# Patient Record
Sex: Male | Born: 1970 | Race: Black or African American
Health system: Northeastern US, Academic
[De-identification: ages and names within clinical notes are randomized; demographics above are authoritative.]

## PROBLEM LIST (undated history)

## (undated) DIAGNOSIS — I10 Essential (primary) hypertension: Secondary | ICD-10-CM

## (undated) DIAGNOSIS — E559 Vitamin D deficiency, unspecified: Principal | ICD-10-CM

## (undated) DIAGNOSIS — D509 Iron deficiency anemia, unspecified: Secondary | ICD-10-CM

## (undated) DIAGNOSIS — N200 Calculus of kidney: Secondary | ICD-10-CM

## (undated) HISTORY — DX: Essential (primary) hypertension: I10

---

## 2010-10-01 LAB — URINE MICROSCOPIC ONLY
Bacteria: NEGATIVE /HPF
RBC: 20 /HPF (ref 0–5)
WBC: 1 /HPF (ref 0–4)

## 2010-10-01 LAB — URINALYSIS W/ RFLX MICROSCOPIC
Bilirubin: NEGATIVE
Glucose: NEGATIVE MG/DL
Leukocyte Esterase: NEGATIVE
Nitrites: NEGATIVE
Protein: NEGATIVE MG/DL
Specific gravity: 1.02 (ref 1.003–1.030)
Urobilinogen: 0.2 EU/DL (ref 0.2–1.0)
pH (UA): 7.5 (ref 5.0–8.0)

## 2011-02-07 MED ORDER — METRONIDAZOLE 500 MG TAB
500 mg | ORAL_TABLET | Freq: Two times a day (BID) | ORAL | Status: AC
Start: 2011-02-07 — End: 2011-02-14

## 2011-02-07 NOTE — ED Provider Notes (Signed)
I was personally available for consultation in the emergency department.  I have reviewed the chart and agree with the documentation recorded by the MLP, including the assessment, treatment plan, and disposition.  Leroy Pettway E Kayce Chismar

## 2011-02-07 NOTE — ED Provider Notes (Signed)
Patient is a 40 y.o. male presenting with STD exposure. The history is provided by the patient.   Exposure to STD  Pertinent negatives include no nausea, no vomiting, no abdominal pain and no penile pain.   Pt states 3 days ago he was told by his sexual partner that she tested positive for Trichomonas.  No dysuria.  No penile discharge.  No increased urgency or frequency.  No bd pain.  No back pain.  No fever or chills.  No NVD.  Normal appetite and activity level.  States he does not always use a condom.    No past medical history on file.     Past Surgical History   Procedure Date   ??? Hx orthopaedic      left leg pins         No family history on file.     History     Social History   ??? Marital Status: Single     Spouse Name: N/A     Number of Children: N/A   ??? Years of Education: N/A     Occupational History   ??? Not on file.     Social History Main Topics   ??? Smoking status: Never Smoker    ??? Smokeless tobacco: Never Used   ??? Alcohol Use: Yes      occasional   ??? Drug Use: Yes     Special: Marijuana   ??? Sexually Active:      Other Topics Concern   ??? Not on file     Social History Narrative   ??? No narrative on file                  ALLERGIES: Review of patient's allergies indicates no known allergies.      Review of Systems   Constitutional: Negative for fever, chills and appetite change.   HENT: Negative for neck pain and neck stiffness.    Respiratory: Negative for cough and chest tightness.    Cardiovascular: Negative for chest pain, palpitations and leg swelling.   Gastrointestinal: Negative for nausea, vomiting, abdominal pain and diarrhea.   Genitourinary: Negative for dysuria, urgency, frequency, hematuria, flank pain, discharge and penile pain.   Musculoskeletal: Negative for myalgias, back pain, joint swelling, arthralgias and gait problem.   Skin: Negative for pallor and rash.   Neurological: Negative for weakness, light-headedness and headaches.       Filed Vitals:    02/07/11 1332   BP: 142/99    Pulse: 76   Temp: 98.6 ??F (37 ??C)   Resp: 16   Height: 6\' 1"  (1.854 m)   Weight: 77.111 kg (170 lb)   SpO2: 100%            Physical Exam   Constitutional: He is oriented to person, place, and time. He appears well-developed and well-nourished. No distress.   HENT:   Head: Normocephalic and atraumatic.   Neck: Normal range of motion. Neck supple. No tracheal deviation present. No thyromegaly present.   Abdominal: Soft. Bowel sounds are normal. He exhibits no distension and no mass. There is no tenderness. There is no rebound and no guarding.   Genitourinary: Penis normal. No penile tenderness.        No penile discharge.  No lesions.  No lymphadenopathy.   Musculoskeletal: Normal range of motion.   Lymphadenopathy:     He has no cervical adenopathy.   Neurological: He is alert and oriented to person, place, and time.  Skin: Skin is warm and dry. No rash noted. No erythema.   Psychiatric: He has a normal mood and affect. His behavior is normal.        MDM    Procedures

## 2011-02-07 NOTE — ED Notes (Signed)
Pt states that his partner called and tested positive for Trich

## 2011-02-07 NOTE — ED Notes (Signed)
Tim Peterson is a 40 y.o. male that was discharged in good.  The patients diagnosis, condition and treatment were explained to  patient and aftercare instructions were given.  The patient verbalized understanding. Patient armband removed and shredded.

## 2013-03-16 ENCOUNTER — Encounter: Payer: Self-pay | Admitting: Emergency Medicine

## 2013-03-16 ENCOUNTER — Emergency Department
Admission: AD | Admit: 2013-03-16 | Disposition: A | Discharge status: Home / Self Care | Payer: Self-pay | Source: Ambulatory Visit

## 2013-03-16 ENCOUNTER — Other Ambulatory Visit: Payer: Self-pay | Admitting: Gastroenterology

## 2013-03-16 LAB — HOLD BLUE

## 2013-03-16 LAB — INFLUENZA A & B PCR: Influenza A & B PCR: 0

## 2013-03-16 LAB — HOLD GREEN WITH GEL

## 2013-03-16 LAB — HOLD LAVENDER

## 2013-03-16 LAB — BLOOD BANK HOLD LAVENDER

## 2013-03-16 LAB — HOLD SST

## 2013-03-16 MED ORDER — KETOROLAC TROMETHAMINE 30 MG/ML IJ SOLN *I*
30.0000 mg | Freq: Once | INTRAMUSCULAR | Status: AC
Start: 2013-03-16 — End: 2013-03-16
  Administered 2013-03-16: 30 mg via INTRAVENOUS
  Filled 2013-03-16: qty 1

## 2013-03-16 MED ORDER — HYDROCODONE-ACETAMINOPHEN 5-325 MG PO TABS *I*
1.0000 | ORAL_TABLET | Freq: Four times a day (QID) | ORAL | Status: DC | PRN
Start: 2013-03-16 — End: 2013-03-30

## 2013-03-16 MED ORDER — SODIUM CHLORIDE 0.9 % IV BOLUS *I*
1000.0000 mL | Freq: Once | Status: AC
Start: 2013-03-16 — End: 2013-03-16
  Administered 2013-03-16: 1000 mL via INTRAVENOUS

## 2013-03-16 MED ORDER — HYDROCODONE-ACETAMINOPHEN 5-325 MG PO TABS *I*
1.0000 | ORAL_TABLET | Freq: Once | ORAL | Status: AC
Start: 2013-03-16 — End: 2013-03-16
  Administered 2013-03-16: 1 via ORAL
  Filled 2013-03-16: qty 1

## 2013-03-16 NOTE — ED Provider Progress Notes (Signed)
ED Provider Progress Note    Pt received on sign out from G. Florio PA-C pending influenza swab. Results negative. Pt continues to have pain. Will give norco. Discharge home. Follow up with PCP       Providence Crosby, PA, 03/16/2013, 9:01 AM    Supervising physician Noah Charon was immediately available

## 2013-03-16 NOTE — ED Notes (Signed)
Pt c/o R lower rib cage pain, pt states that pain began 2 days ago, pt denies SOB, pt states that he is pain free if he sits or lays still, pt states that pain worsens with deep inspiration and movement, pt denies injury, IV established, labs drawn and sent, pt resting in bed, pt on telemetry, call bell in reach, awaiting provider eval

## 2013-03-16 NOTE — ED Notes (Signed)
Pt sleeping. 

## 2013-03-16 NOTE — ED Notes (Signed)
Pt to XR

## 2013-03-16 NOTE — ED Notes (Signed)
Right sided chest pain non radiating that began on 03-15-13, increased discomfort with deep inspiration

## 2013-03-16 NOTE — ED Provider Notes (Signed)
History     Chief Complaint   Patient presents with   . Chest Pain     HPI Comments: This is a 42 yo male who presents to the ED with complaints of right lower chest pain x 2 days. Pt has been in the hospital with his wife who is admitted.  Pt states has been coughing. No fevers or chills. No nausea.  No recent travel. No lower extremity pain or swelling. Pt states that pain is worse when you press on area and when he takes  Deep breath. Pain does not radiate       History provided by:  Patient      History reviewed. No pertinent past medical history.         History reviewed. No pertinent past surgical history.    History reviewed. No pertinent family history.      Social History      reports that he has never smoked. He does not have any smokeless tobacco history on file. He reports that he does not drink alcohol or use illicit drugs. His sexual activity history is not on file.    Living Situation    Questions Responses    Patient lives with Spouse    Homeless     Caregiver for other family member     External Services     Employment     Domestic Violence Risk           Problem List     There is no problem list on file for this patient.      Review of Systems   Review of Systems   Constitutional: Negative for fever and chills.   HENT: Negative for ear pain, congestion, sore throat, trouble swallowing, neck pain and neck stiffness.    Respiratory: Positive for cough. Negative for shortness of breath.    Cardiovascular: Positive for chest pain. Negative for palpitations and leg swelling.   Gastrointestinal: Negative for nausea, vomiting and abdominal pain.   Genitourinary: Negative for difficulty urinating.   Musculoskeletal: Positive for arthralgias. Negative for myalgias.   Skin: Negative for rash and wound.   Neurological: Negative for dizziness, weakness, numbness and headaches.       Physical Exam     ED Triage Vitals   BP Pulse Heart Rate(via Pulse Ox) Resp Temp Temp Source SpO2 O2 Device O2 Flow Rate      03/16/13 0241 -- 03/16/13 0239 03/16/13 0239 03/16/13 0239 03/16/13 0239 03/16/13 0239 03/16/13 0239 --   151/106 mmHg  68 16 37.1 C (98.8 F) TEMPORAL 100 % None (Room air)       Weight           03/16/13 0239           79.379 kg (175 lb)               Physical Exam   Constitutional: He is oriented to person, place, and time. He appears well-developed and well-nourished.   HENT:   Head: Normocephalic and atraumatic.   Mouth/Throat: Oropharynx is clear and moist.   Eyes: Conjunctivae and EOM are normal.   Cardiovascular: Normal rate, regular rhythm and normal heart sounds.    Pulmonary/Chest: Effort normal and breath sounds normal. Chest wall is not dull to percussion. He exhibits tenderness. He exhibits no crepitus, no deformity and no retraction.       Abdominal: Soft. Bowel sounds are normal.   Musculoskeletal: Normal range of motion.   Neurological:  He is alert and oriented to person, place, and time.   Skin: Skin is warm and dry.   Psychiatric: He has a normal mood and affect.       Medical Decision Making      Amount and/or Complexity of Data Reviewed  Clinical lab tests: reviewed and ordered  Tests in the radiology section of CPT: ordered and reviewed        Initial Evaluation:  ED First Provider Contact    Date/Time Event User Comments    03/16/13 [phone] ED Provider First Contact Corliss Skains J Initial Face to Face Provider Contact          Patient seen by me as above    Assessment:  42 y.o., male comes to the ED with lower right chest pain reproducible, with cough    Differential Diagnosis includes costochondritis,  i have a very low suspicion for as or PE.  Possible flu              Plan: eval, flu swab. Cxr, fluids, torodol       Landis Martins, [place]    Supervising physician Dr. Rada Hay was immediately available      Landis Martins, [place]  03/16/13 [phone]

## 2013-03-16 NOTE — Discharge Instructions (Signed)
You were seen in the ED for chest pain. Given your symptoms and evaluation your diagnosis is most likely costochondritis.    You should follow up in 1 week with your primary doctor.    You should continue to take your home medications as prescribed. Please use hydrocodone for severe pain. Use as prescribed. Do not drink alcohol, drive, or operate machinery while on this medication. Do not take additional Tylenol or products containing acetaminophen while on this medication. Take with food to prevent stomach upset/nausea/vomiting.  Please use ibuprofen for pain. Use 600 mg of ibuprofen 3 times daily for pain. Take with food to prevent stomach upset/nausea/vomiting.    Please use ice on your injury - apply for 20 minutes every 1-2 hours while awake for next 2 days.     Return to the ED should you have any further acute medical problems or any worsening symptoms, especially shortness of breath.    Should you have any further questions about your care or evaluation you should call or make an appointment with your primary physician to review them.

## 2013-03-25 ENCOUNTER — Ambulatory Visit: Payer: Self-pay | Admitting: Cardiovascular Disease

## 2013-03-26 LAB — EKG 12-LEAD
P: 45 degrees
QRS: 35 degrees
Rate: 58 {beats}/min
T: 58 degrees

## 2013-03-30 ENCOUNTER — Encounter: Payer: Self-pay | Admitting: Gastroenterology

## 2013-03-30 ENCOUNTER — Ambulatory Visit: Payer: Self-pay | Admitting: Cardiovascular Disease

## 2013-03-30 ENCOUNTER — Encounter: Payer: Self-pay | Admitting: Cardiovascular Disease

## 2013-03-30 VITALS — BP 132/64 | HR 64 | Ht 73.0 in | Wt 170.0 lb

## 2013-03-30 DIAGNOSIS — I10 Essential (primary) hypertension: Secondary | ICD-10-CM

## 2013-03-30 DIAGNOSIS — Z0000 Encounter for general adult medical examination without abnormal findings: Secondary | ICD-10-CM

## 2013-03-30 NOTE — Progress Notes (Addendum)
[facility]   Academic Practice Note    Subjective     Chief Concern: Jackson Dunn ["Jackson Dunn"] is a 43 y.o. male who presents to establish care     Last saw a doctor ~ 20 years ago, but was in prison for ~ 3 years [~ 5 years prior] in which he was started on HCTZ for HTN. Patient states that he had his immunizations done, was tested for HIV 2 years prior, and had a TB skin test done upon his release from prison.     Patient has noticed LAD on his legs/arms that have been stable in size. No pain, erythema, pruritis. They are mobile. Lump on RIGHT lateral thigh has been present for ~ 5 years.     He has also been trying to gain weight but has been able to. Wife cooks healthily meals. Patient does not endorse any loose stools, N/V, or flatulence.      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; [place]  Allergies: NKMA  Meds: No active medications      ROS  As per HPI, otherwise reviewed and noncontributory    Objective     BP 132/64   Pulse 64   Ht 1.854 m (6\' 1" )   Wt 77.111 kg (170 lb)   BMI 22.43 kg/m2     GEN: pleasant, NAD, A&Ox3  HENT: NCAT,  MMM, anicteric, TMs w/o erythema or discharge  NECK: supple; no cervical lymphadenopathy, no thyriodmegaly   CVS: RRR, S1S2, no rubs/murmurs/gallops  PULM: non-labored, CTAB, good inspiratory effort  ABD: soft, NDNT, no organomegaly appreciated  EXT: warm, dry, + radial, no edema; two soft, mobile lump on RIGHT thigh ~ 3-4 cm w/o erythema, induration, or swelling; small, mobile lump on distal RIGHT arm ~ 1-2 cm     Labs  No recent abs to review     Assessment   Plan     Jackson Dunn was seen today for healthcare maintenance    Health care maintenance  Patient is up to date on immunizations [declined influ] and  screenings. Advised patient on proper diet, exercise, and safety. Will order CBC, BMP, lipid panel, and A1c.     Hypetension [stable]  Patient is no longer taking HCTZ that was prescribed while he was in prison ~ 5 years prior. His BP is currently well controlled and under his JNC 8 guidelines. Will continue to monitor with diet and exercise [no medications at this time]. Follow up in 6 months for BP.     Will check a BMP for Cr function.      PCMH Hypertension Plan  Based on this patients clinical history and according to JNC guidelines target BP goal is: less than 140/90 (or 150/90 due to age [facility](JNC8))  Based on the patient's last BP of BP: 132/64 mmHg the patient is: at goal  The plan to reach goal   1.  Reviewed pt's understanding of medications including any barriers to adherence.   2.  Recommended lifestyle modifications: exercise plan and dietary sodium reduction   3.  The patient understands their clinical goals and will undertake self-management recommendations including:all     4.  Medication Management: no changes made    5.  Referral to Care Management:: No   6.  Patient Ed/Self-management tools provided: N/A    Health Maintenance  Colonoscopy: Will address at age 48  Immunizations: Tdap [awiating records], Influ [declined]  Hgb A1c: will order today    FU Visit: 6 months for HTN    Charlett Nose, MD on 03/30/2013 at 1:45 PM    Attending Physician Addendum:    I personally interviewed, examined and discussed the care of Jackson Dunn with Charlett Nose, MD at the time of the visit including HPI, PMH, home mediations, family history, social history, and ROS, as well as physical examination, labs, assessment, and plans. I agree with the assessment and plan of care as documented above.      Key elements:   HTN.  Started on Lisinopril while in jail a few years ago but did not continue it.  Below goal today on no meds.  MJ.  Occasional.  Not causing problems in life.  Multiple lipomas.  Soft, mobile, no  change per pt over multiple years.  No suggestion of neoplasm.  HCM.  Lipids, a1c, BMP.    Windy Canny, MD,PHD, Faculty, [facility] 03/30/2013, 2:55 PM

## 2013-03-30 NOTE — Patient Instructions (Addendum)
You were seen in the clinic today to establish care. Please get your blood work done. Please get your immunization records and have them sent over to our office.     Please let us know if you have any questions

## 2013-03-31 ENCOUNTER — Ambulatory Visit
Admit: 2013-03-31 | Discharge: 2013-03-31 | Disposition: A | Discharge status: Home / Self Care | Payer: Self-pay | Source: Ambulatory Visit | Attending: Cardiovascular Disease | Admitting: Cardiovascular Disease

## 2013-03-31 DIAGNOSIS — Z0000 Encounter for general adult medical examination without abnormal findings: Secondary | ICD-10-CM

## 2013-03-31 DIAGNOSIS — I10 Essential (primary) hypertension: Secondary | ICD-10-CM

## 2013-03-31 LAB — BASIC METABOLIC PANEL
Anion Gap: 9 (ref 7–16)
CO2: 30 mmol/L — ABNORMAL HIGH (ref 20–28)
Calcium: 9.9 mg/dL (ref 9.0–10.3)
Chloride: 102 mmol/L (ref 96–108)
Creatinine: 1.09 mg/dL (ref 0.67–1.17)
GFR,Black: 98 *
GFR,Caucasian: 84 *
Glucose: 106 mg/dL — ABNORMAL HIGH (ref 60–99)
Lab: 12 mg/dL (ref 6–20)
Potassium: 4 mmol/L (ref 3.3–5.1)
Sodium: 141 mmol/L (ref 133–145)

## 2013-03-31 LAB — LIPID PANEL
Chol/HDL Ratio: 2.7 (ref 0.0–5.0)
Cholesterol: 167 mg/dL
HDL: 63 mg/dL
LDL Calculated: 93 mg/dL
Non HDL Cholesterol: 104 mg/dL
Triglycerides: 54 mg/dL

## 2013-03-31 LAB — CBC
Hematocrit: 42 % (ref 40–51)
Hemoglobin: 14.3 g/dL (ref 13.7–17.5)
MCH: 32 pg (ref 26–32)
MCHC: 34 g/dL (ref 32–37)
MCV: 93 fL — ABNORMAL HIGH (ref 79–92)
Platelets: 302 10*3/uL (ref 150–330)
RBC: 4.5 MIL/uL — ABNORMAL LOW (ref 4.6–6.1)
RDW: 13.4 % (ref 11.6–14.4)
WBC: 6.5 10*3/uL (ref 4.2–9.1)

## 2013-04-01 ENCOUNTER — Encounter: Payer: Self-pay | Admitting: Cardiovascular Disease

## 2013-04-01 ENCOUNTER — Telehealth: Payer: Self-pay | Admitting: Cardiovascular Disease

## 2013-04-01 DIAGNOSIS — I10 Essential (primary) hypertension: Secondary | ICD-10-CM | POA: Insufficient documentation

## 2013-04-01 LAB — HEMOGLOBIN A1C: Hemoglobin A1C: 5.7 % (ref 4.0–6.0)

## 2013-04-01 NOTE — Telephone Encounter (Signed)
Called patient to review labs with him. No questions from patient.     Charlett NoseMustafa Daquon Greenleaf, MD 04/01/2013 12:55 PM

## 2013-11-23 ENCOUNTER — Ambulatory Visit: Payer: Self-pay | Admitting: Internal Medicine

## 2013-12-08 ENCOUNTER — Ambulatory Visit: Payer: Self-pay | Admitting: Internal Medicine

## 2013-12-28 ENCOUNTER — Ambulatory Visit
Admit: 2013-12-28 | Discharge: 2013-12-28 | Disposition: A | Discharge status: Home / Self Care | Payer: Self-pay | Source: Ambulatory Visit | Admitting: Endocrinology

## 2013-12-28 ENCOUNTER — Ambulatory Visit: Payer: Self-pay | Admitting: Cardiovascular Disease

## 2013-12-28 ENCOUNTER — Encounter: Payer: Self-pay | Admitting: Cardiovascular Disease

## 2013-12-28 VITALS — BP 128/76 | HR 72 | Ht 73.0 in | Wt 159.0 lb

## 2013-12-28 DIAGNOSIS — F419 Anxiety disorder, unspecified: Secondary | ICD-10-CM

## 2013-12-28 DIAGNOSIS — I10 Essential (primary) hypertension: Secondary | ICD-10-CM

## 2013-12-28 DIAGNOSIS — F32A Depression, unspecified: Secondary | ICD-10-CM

## 2013-12-28 MED ORDER — FLUOXETINE HCL 20 MG PO CAPS *I*
20.0000 mg | ORAL_CAPSULE | Freq: Every day | ORAL | Status: AC
Start: 2013-12-28 — End: 2014-06-26

## 2013-12-28 NOTE — Progress Notes (Signed)
[facility]   Academic Practice Note    Subjective     Chief Concern: Jackson Dunn is a 43 y.o. male who presents for Hypertension and Anxiety     [place]    Back Discomfort: Patient reports upper > lower back discomfort for the past few months. He has been "dealing with it" taking small amounts of NSAIDs and tylenol [~ 1 disposable packet] but reports lot's of stress with work/life etc "a knot in his back". No paraesthesias, sensory/motor deficiets, or loss of bowel/bladder.     Poor Mood: Patient would like to talk to a counselor re: his feelings [life/family/marriage]    Sleep: Patient sleeps with the TV on and sleeps around 9PM to 5AM; He feels as if he has a state of hyperarousal [hears everything].      Patient's allergies, past medical, surgical, social and family histories were reviewed with no updates.     ROS  As per HPI, otherwise reviewed and noncontributory    Objective     BP 128/76 mmHg   Pulse 72   Ht 1.854 m (6\' 1" )   Wt 72.122 kg (159 lb)   BMI 20.98 kg/m2     GEN: pleasant male in NAD; alert, comfortable, good affect  HENT: NCAT,  MMM, anicteric,  NECK: supple; no cervical lymphadenopathy  CVS: RRR, S1S2, no rubs/murmurs/gallops  PULM: non-labored, CTAB, good inspiratory effort  ABD: soft, NDNT, no organomegaly appreciated  EXT: warm, dry, + radial pulses, no edema      Assessment   Plan     HTN  Patient is no longer taking HCTZ that was prescribed while he was in prison ~ 5 years prior. His BP is currently well controlled and under his JNC 8 guidelines. Will continue to monitor with diet and exercise [no medications at this time].     Depression + Poor mood + insomnia + ED [PHQ-9 = 10]  Educated patient on prior sleep hygeine and ways to cope with his stress. Advised taking motrin 800 mg TID x5 days for his pain but that starting an anti-depressant and counseling will help him as well. Patient aware that therapy may take ~ 6 weeks to have effects and that he  may have more sexual side effects in the short term  1   Will start fluoxetine 20 mg daily. If no response in 4-5 weeks, then will likely increase dose to 40 mg  2   Gave patient phone number to [facility] for self-referral  3   Will re-evaluate patient in 6 weeks     Health Maintenance  Colonoscopy: Will address at age 43  Immunizations: Tdap [awiating records], Influ [declined]  Hgb A1c: 5.7% 03/2013    FU Visit: 6 weeks for depression     Charlett NoseMustafa Danny Zimny, MD on 12/28/2013 at 2:14 PM    Medications  No current outpatient prescriptions on file.     No current facility-administered medications for this visit.       Any hospitalizations or visits to ED/Specialists since last clinic visit? No    PCMH Hypertension Plan  Based on this patients clinical history and according to JNC guidelines target BP goal is: less than 140/90 (or 150/90 due to age [facility](JNC8))  Based on the patient's last BP of BP: 128/76 mmHg the patient is: at goal  The plan to reach goal   1.  Reviewed pt's understanding of medications including any barriers to adherence.  2.  Recommended lifestyle modifications: exercise plan   3.  The patient understands their clinical goals and will undertake self-management recommendations including:all     4.  Medication Management: no changes made    5.  Referral to Care Management:: No   6.  Patient Ed/Self-management tools provided: N/A      PCMH Depression Plan  Based upon the patient's last PHQ of   and results of patient's Depression Action Plan, the patient UE:[id number]:Revised goal, Discussed with patient  The plan to reach goal   1.  I addressed the patient's barriers to reaching their stated goals: Yes   2.  I reviewed patient's understanding of medications including any barriers to adherence: Yes   3.  Discussed/counseled with patient the following plan: healthy diet, finding ways to relax, making time for enjoyable activities   4.  The patient understands their clinical goals and will undertake  self-management including: Increasing phyiscal activity, spending time with supportive people   5.  Medication Management: the following medication changes were made today: see above.   6.  Referral to Care Management: No   7.  Patient Ed/Self-management tools such as Depression Management Action Plan provided: N/A

## 2013-12-28 NOTE — Patient Instructions (Addendum)
You were seen in the clinic today for follow up. Please start taking Prozac 20 mg every day    For back discomfort, take motrin 800 mg three times a day for no more than 5 days at a time.     Three Key Tips for a Good Diet  1) eat breakfast - do not skip it or just have coffee [fruit, eggs, cereal]  2) portion control - make sure you can see your plate  3) minimize snacking    Please let us know if you have any questions    [facility]   [address]  [facility]  [place] [id number]  Phone: [phone]

## 2013-12-31 ENCOUNTER — Emergency Department
Admission: EM | Admit: 2013-12-31 | Discharge status: Against Medical Advice | Payer: Self-pay | Source: Ambulatory Visit

## 2013-12-31 MED ORDER — HYDROMORPHONE HCL PF 1 MG/ML IJ SOLN *WRAPPED*
1.0000 mg | Freq: Once | INTRAMUSCULAR | Status: DC
Start: 2013-12-31 — End: 2013-12-31

## 2013-12-31 MED ORDER — SODIUM CHLORIDE 0.9 % IV BOLUS *I*
1000.0000 mL | Freq: Once | Status: DC
Start: 2013-12-31 — End: 2013-12-31

## 2013-12-31 MED ORDER — ONDANSETRON HCL 2 MG/ML IV SOLN *I*
4.0000 mg | Freq: Once | INTRAMUSCULAR | Status: DC
Start: 2013-12-31 — End: 2013-12-31

## 2013-12-31 NOTE — ED Notes (Signed)
C/o abd pain over the past month with weight loss, pain was worse this am after eating greasy sausage. Pain is right side that radiates to back

## 2013-12-31 NOTE — First Provider Contact (Addendum)
ED Medical Screening Exam Note    Initial provider evaluation performed by   ED First Provider Contact     Date/Time Event User Comments    12/31/13 1219 ED Provider First Contact Rosaire Cueto L Initial Face to Face Provider Contact        Pt is a 43 y.o. black male co RUQ pain x 2 days, worse with eating, pt is having weight loss, pt was prescribed some depression medicine but has not taken it yet.      No sob no cp   Vital signs reviewed.    Orders placed:  LABS, U/S and ANALGESIA     Patient requires further evaluation.     Mollyann Halbert, PA, 12/31/2013, 12:19 PM    Supervising physician Dr. Theophilus BonesKamali  was immediately available

## 2013-12-31 NOTE — ED Notes (Addendum)
Family and patient displeased [facility] being [facility]  Became argumentative and belligerant - began to threaten NP  DPS contacted  IV removed by A. Johnson   Pt and family escorted from ED by DPS

## 2015-06-27 ENCOUNTER — Inpatient Hospital Stay
Admit: 2015-06-27 | Discharge: 2015-06-27 | Disposition: A | Discharge status: Home / Self Care | Payer: Self-pay | Attending: Emergency Medicine

## 2015-06-27 ENCOUNTER — Emergency Department: Admit: 2015-06-27 | Payer: Self-pay | Primary: Nurse Practitioner

## 2015-06-27 DIAGNOSIS — N201 Calculus of ureter: Secondary | ICD-10-CM

## 2015-06-27 LAB — CBC WITH AUTOMATED DIFF
ABS. BASOPHILS: 0.1 10*3/uL — ABNORMAL HIGH (ref 0.0–0.06)
ABS. EOSINOPHILS: 0.1 10*3/uL (ref 0.0–0.4)
ABS. LYMPHOCYTES: 2.2 10*3/uL (ref 0.9–3.6)
ABS. MONOCYTES: 0.4 10*3/uL (ref 0.05–1.2)
ABS. NEUTROPHILS: 5 10*3/uL (ref 1.8–8.0)
BASOPHILS: 1 % (ref 0–2)
EOSINOPHILS: 1 % (ref 0–5)
HCT: 45.5 % (ref 36.0–48.0)
HGB: 15.3 g/dL (ref 13.0–16.0)
LYMPHOCYTES: 29 % (ref 21–52)
MCH: 30.8 PG (ref 24.0–34.0)
MCHC: 33.6 g/dL (ref 31.0–37.0)
MCV: 91.7 FL (ref 74.0–97.0)
MONOCYTES: 5 % (ref 3–10)
MPV: 9.2 FL (ref 9.2–11.8)
NEUTROPHILS: 64 % (ref 40–73)
PLATELET: 275 10*3/uL (ref 135–420)
RBC: 4.96 M/uL (ref 4.70–5.50)
RDW: 13.1 % (ref 11.6–14.5)
WBC: 7.7 10*3/uL (ref 4.6–13.2)

## 2015-06-27 LAB — METABOLIC PANEL, COMPREHENSIVE
A-G Ratio: 1.3 (ref 0.8–1.7)
ALT (SGPT): 25 U/L (ref 16–61)
AST (SGOT): 23 U/L (ref 15–37)
Albumin: 4.5 g/dL (ref 3.4–5.0)
Alk. phosphatase: 64 U/L (ref 45–117)
Anion gap: 11 mmol/L (ref 3.0–18)
BUN/Creatinine ratio: 13 (ref 12–20)
BUN: 13 MG/DL (ref 7.0–18)
Bilirubin, total: 0.7 MG/DL (ref 0.2–1.0)
CO2: 27 mmol/L (ref 21–32)
Calcium: 9.9 MG/DL (ref 8.5–10.1)
Chloride: 104 mmol/L (ref 100–108)
Creatinine: 1.02 MG/DL (ref 0.6–1.3)
GFR est AA: >60 mL/min/{1.73_m2} (ref >60)
GFR est non-AA: >60 mL/min/{1.73_m2} (ref >60)
Globulin: 3.6 g/dL (ref 2.0–4.0)
Glucose: 127 mg/dL — ABNORMAL HIGH (ref 74–99)
Potassium: 3.6 mmol/L (ref 3.5–5.5)
Protein, total: 8.1 g/dL (ref 6.4–8.2)
Sodium: 142 mmol/L (ref 136–145)

## 2015-06-27 LAB — LIPASE
Lipase: 96 U/L (ref 73–393)
Lipase: 97 U/L (ref 73–393)

## 2015-06-27 MED ORDER — HYDROMORPHONE (PF) 1 MG/ML IJ SOLN
1 mg/mL | Freq: Once | INTRAMUSCULAR | Status: DC
Start: 2015-06-27 — End: 2015-06-27

## 2015-06-27 MED ORDER — HYDROMORPHONE (PF) 1 MG/ML IJ SOLN
1 mg/mL | INTRAMUSCULAR | Status: AC
Start: 2015-06-27 — End: 2015-06-27
  Administered 2015-06-27: 17:00:00 via INTRAVENOUS

## 2015-06-27 MED ORDER — HYDROMORPHONE (PF) 1 MG/ML IJ SOLN
1 mg/mL | INTRAMUSCULAR | Status: AC
Start: 2015-06-27 — End: 2015-06-27
  Administered 2015-06-27: 14:00:00 via INTRAVENOUS

## 2015-06-27 MED ORDER — SODIUM CHLORIDE 0.9% BOLUS IV
0.9 % | Freq: Once | INTRAVENOUS | Status: DC
Start: 2015-06-27 — End: 2015-06-27

## 2015-06-27 MED ORDER — TRAMADOL 50 MG TAB
50 mg | ORAL_TABLET | Freq: Four times a day (QID) | ORAL | 0 refills | Status: DC | PRN
Start: 2015-06-27 — End: 2018-02-02

## 2015-06-27 MED ORDER — KETOROLAC TROMETHAMINE 30 MG/ML INJECTION
30 mg/mL (1 mL) | INTRAMUSCULAR | Status: AC
Start: 2015-06-27 — End: 2015-06-27
  Administered 2015-06-27: 15:00:00 via INTRAVENOUS

## 2015-06-27 MED ORDER — ONDANSETRON (PF) 4 MG/2 ML INJECTION
4 mg/2 mL | INTRAMUSCULAR | Status: AC
Start: 2015-06-27 — End: 2015-06-27
  Administered 2015-06-27: 14:00:00 via INTRAVENOUS

## 2015-06-27 MED ORDER — SODIUM CHLORIDE 0.9% BOLUS IV
0.9 % | Freq: Once | INTRAVENOUS | Status: AC
Start: 2015-06-27 — End: 2015-06-27
  Administered 2015-06-27: 16:00:00 via INTRAVENOUS

## 2015-06-27 MED ORDER — SODIUM CHLORIDE 0.9% BOLUS IV
0.9 % | Freq: Once | INTRAVENOUS | Status: AC
Start: 2015-06-27 — End: 2015-06-27
  Administered 2015-06-27: 14:00:00 via INTRAVENOUS

## 2015-06-27 MED ORDER — ONDANSETRON 4 MG TAB, RAPID DISSOLVE
4 mg | ORAL_TABLET | Freq: Three times a day (TID) | ORAL | 0 refills | Status: DC | PRN
Start: 2015-06-27 — End: 2018-02-02

## 2015-06-27 MED FILL — KETOROLAC TROMETHAMINE 30 MG/ML INJECTION: 30 mg/mL (1 mL) | INTRAMUSCULAR | Qty: 1

## 2015-06-27 MED FILL — ONDANSETRON (PF) 4 MG/2 ML INJECTION: 4 mg/2 mL | INTRAMUSCULAR | Qty: 2

## 2015-06-27 MED FILL — SODIUM CHLORIDE 0.9 % IV: INTRAVENOUS | Qty: 1000

## 2015-06-27 MED FILL — HYDROMORPHONE (PF) 1 MG/ML IJ SOLN: 1 mg/mL | INTRAMUSCULAR | Qty: 1

## 2015-06-27 NOTE — ED Provider Notes (Signed)
HPI Comments: 10:27 AM Tim Peterson is a 45 y.o. male with h/o kidney stones who presents to ED complaining of "excruciating" right flank pain onset 0530. The patient states this is the third time he has been in the ED for kidney stones. He reports he has passed 3 kidney stones since his pain began, and they look like "white rocks". He also complains of some nausea. Patient reports he is unable to urinate. No other concerns or symptoms at this time.    Patient states he  Has has 3 kidney stone- asked what color - stated white. I proceeded to examine his arms and he became upset.      PCP: PROVIDER UNKNOWN      The history is provided by the patient.   Tim Peterson is a 45 y.o. male presents with abdominal pain that started about 5:30 this morning. States feels like kidney stone. Has had kidney stones in the past. Nausea, vomiting. Denies fever     Past Medical History:   Diagnosis Date   ??? Kidney stones        History reviewed. No pertinent surgical history.      History reviewed. No pertinent family history.    Social History     Social History   ??? Marital status: SINGLE     Spouse name: N/A   ??? Number of children: N/A   ??? Years of education: N/A     Occupational History   ??? Not on file.     Social History Main Topics   ??? Smoking status: Unknown If Ever Smoked   ??? Smokeless tobacco: Not on file   ??? Alcohol use No   ??? Drug use: No   ??? Sexual activity: Not on file     Other Topics Concern   ??? Not on file     Social History Narrative   ??? No narrative on file         ALLERGIES: Review of patient's allergies indicates no known allergies.    Review of Systems   Constitutional: Negative for diaphoresis and fever.   HENT: Negative for congestion.    Respiratory: Negative for cough and shortness of breath.    Cardiovascular: Negative for chest pain.   Gastrointestinal: Negative for abdominal pain and nausea.   Genitourinary: Positive for flank pain.        Right flank pain onset 4 AM.    Musculoskeletal: Negative for back pain.   Skin: Negative for rash.   Neurological: Negative for dizziness.   All other systems reviewed and are negative.  Constitutional:  Denies malaise, fever, chills.   Head:  Denies injury.   Face:  Denies injury or pain.   ENMT:  Denies sore throat.   Neck:  Denies injury or pain.   Chest:  Denies injury.   Cardiac:  Denies chest pain or palpitations.   Respiratory:  Denies cough, wheezing, difficulty breathing, shortness of breath.   GI/ABD:  pain, nausea, vomiting  Denies injury, diarrhea.   GU:  Denies injury, pain, dysuria or urgency.   Back:  Denies injury or pain.   Pelvis:  Denies injury or pain.   Extremity/MS:  Denies injury or pain.   Neuro:  Denies headache, LOC, dizziness, neurologic symptoms/deficits/paresthesias.   Skin: Denies injury, rash, itching or skin changes.      Vitals:    06/27/15 1005 06/27/15 1010   BP:  (!) 158/131   Pulse:  (!) 50   Resp: 28  Temp: 97.8 ??F (36.6 ??C)    SpO2: 99%    Weight: 79.4 kg (175 lb)    Height:  (1.854 m)             Physical Exam   Constitutional: He is oriented to person, place, and time. He appears well-developed and well-nourished.   HENT:   Head: Normocephalic and atraumatic.   Eyes: Pupils are equal, round, and reactive to light.   Neck: Neck supple.   Cardiovascular: Normal rate.    No murmur heard.  Pulmonary/Chest: Effort normal. He has no wheezes.   Abdominal: Soft. There is no tenderness.   Right flank tenderness.   Musculoskeletal: He exhibits no tenderness.   Neurological: He is alert and oriented to person, place, and time.   Skin: No pallor.   Nursing note and vitals reviewed.  CONSTITUTIONAL: Alert, in no apparent distress; well-developed; well-nourished.   HEAD:  Normocephalic, atraumatic.   EYES: PERRL; EOM's intact.   ENTM: Nose: No rhinorrhea; Throat: mucous membranes moist. Posterior pharynx-normal.  Neck:  No JVD, supple without lymphadenopathy.  RESP: Chest clear, equal breath sounds.    CV: S1 and S2 WNL; No murmurs, gallops or rubs.   GI: Abdomen soft and non-tender. No masses or organomegaly.   UPPER EXT:  Normal inspection.   LOWER EXT: Normal inspection.  NEURO: strength 5/5 and sym, sensation intact.   SKIN: No rashes; Normal for age and stage.   PSYCH:  Alert and oriented, normal affect.       MDM  Number of Diagnoses or Management Options  Diagnosis management comments: DDx: gastroenteritis, GERD, hernia, hepatitis, pancreatitis, gallbladder etiology, constipation, adhesions, UTI, pyelo, kidney stones, STD, appendicitis, diverticulitis, SBO, GI bleed, mesenteric ischemia, AAA, cardiac etiology, musculoskeletal pain/spasm, malignancy  IMPRESSION AND MEDICAL DECISION MAKING:  Based upon the patient's presentation with noted HPI and PE, along with the work up done in the emergency department, I believe that the patient has a kidney stone. Will treat and have him follow up with Urology.   PROGRESS NOTE: One or more blood pressure readings were noted elevated during the Pt's presentation in the emergency department this date. This abnormal reading has been cited in the Pt's diagnosis, and they have been encouraged to follow up with their primary care physician, or referred to a consultant for further evaluation and treatment.Pt advised of elevated BP. Advised Pt the need for follow up for the elevated BP. Advised Pt to monitor and record BP readings. ACEP guidelines are  Initiating treatment for asymptomatic hypertension in the ED is not necessary when patients have follow-up; (2) Rapidly lowering blood pressure in asymptomatic patients in the ED is unnecessary and may be harmful in some patients; (3) When ED treatment for asymptomatic hypertension is initiated, blood pressure management should attempt to gradually lower blood pressure and should not be expected to be normalized during the initial ED visit.   The patient will be discharged home.  Warning signs of worsening condition  were discussed and understood by the patient. Based on patient's age, coexisting illness, exam, and the results of this ED evaluation, the decision to treat as an outpatient was made. Based on the information available at time of discharge, acute pathology requiring immediate intervention was deemed relative unlikely. While it is impossible to completely exclude the possibility of underlying serious disease or worsening of condition, I feel the relative likelihood is extremely low. I discussed this uncertainty with the patient, who understood ED evaluation and treatment and  felt comfortable with the outpatient treatment plan. All questions regarding care, test results, and follow up were answered. The patient is stable and appropriate to discharge. They understand that they should return to the emergency department for any new or worsening symptoms. I stressed the importance of follow up for repeat assessment and possibly further evaluation/treatment.               Amount and/or Complexity of Data Reviewed  Clinical lab tests: ordered and reviewed  Tests in the radiology section of CPT??: ordered and reviewed  Tests in the medicine section of CPT??: ordered and reviewed  Independent visualization of images, tracings, or specimens: yes      ED Course       Procedures    Vitals:  Patient Vitals for the past 12 hrs:   Temp Pulse Resp BP SpO2   06/27/15 1010 - (!) 50 - (!) 158/131 -   06/27/15 1005 97.8 ??F (36.6 ??C) - 28 - 99 %         Medications ordered:   Medications   sodium chloride 0.9 % bolus infusion 1,000 mL (1,000 mL IntraVENous New Bag 06/27/15 1015)   ondansetron (ZOFRAN) injection 4 mg (4 mg IntraVENous Given 06/27/15 1015)   HYDROmorphone (PF) (DILAUDID) injection 1 mg (1 mg IntraVENous Given 06/27/15 1015)         Lab findings:  No results found for this or any previous visit (from the past 12 hour(s)).    EKG interpretation by ED Physician:      X-Ray, CT or other radiology findings or impressions:   No orders to display       Progress notes, Consult notes or additional Procedure notes:       Disposition:  Diagnosis: No diagnosis found.    Disposition:   10:18 AM  Pt reevaluated at this time and is resting comfortably in NAD. Discussed results and findings, as well as, diagnosis and plan for discharge. Pt verbalizes understanding and agreement with plan. All questions addressed at this time.     Follow-up Information     None           Patient's Medications    No medications on file       Vitals:  Patient Vitals for the past 12 hrs:   Temp Pulse Resp BP SpO2   06/27/15 1021 - - - - 99 %   06/27/15 1010 - (!) 50 - (!) 158/131 -   06/27/15 1005 97.8 ??F (36.6 ??C) - 28 - 99 %         Medications ordered:   Medications   sodium chloride 0.9 % bolus infusion 1,000 mL (1,000 mL IntraVENous New Bag 06/27/15 1015)   sodium chloride 0.9 % bolus infusion 1,000 mL (not administered)   HYDROmorphone (PF) (DILAUDID) injection 1 mg (not administered)   ondansetron (ZOFRAN) injection 4 mg (4 mg IntraVENous Given 06/27/15 1015)   HYDROmorphone (PF) (DILAUDID) injection 1 mg (1 mg IntraVENous Given 06/27/15 1015)         Lab findings:  Recent Results (from the past 12 hour(s))   CBC WITH AUTOMATED DIFF    Collection Time: 06/27/15 10:10 AM   Result Value Ref Range    WBC 7.7 4.6 - 13.2 K/uL    RBC 4.96 4.70 - 5.50 M/uL    HGB 15.3 13.0 - 16.0 g/dL    HCT 16.1 09.6 - 04.5 %    MCV 91.7 74.0 - 97.0 FL  MCH 30.8 24.0 - 34.0 PG    MCHC 33.6 31.0 - 37.0 g/dL    RDW 16.1 09.6 - 04.5 %    PLATELET 275 135 - 420 K/uL    MPV 9.2 9.2 - 11.8 FL    NEUTROPHILS 64 40 - 73 %    LYMPHOCYTES 29 21 - 52 %    MONOCYTES 5 3 - 10 %    EOSINOPHILS 1 0 - 5 %    BASOPHILS 1 0 - 2 %    ABS. NEUTROPHILS 5.0 1.8 - 8.0 K/UL    ABS. LYMPHOCYTES 2.2 0.9 - 3.6 K/UL    ABS. MONOCYTES 0.4 0.05 - 1.2 K/UL    ABS. EOSINOPHILS 0.1 0.0 - 0.4 K/UL    ABS. BASOPHILS 0.1 (H) 0.0 - 0.06 K/UL    DF AUTOMATED              X-Ray, CT or other radiology findings or impressions:  CT ABD PELV WO CONT    (Results Pending)       Progress notes, Consult notes or additional Procedure notes:       Disposition:  Diagnosis: No diagnosis found.        Follow-up Information     None           Patient's Medications    No medications on file

## 2015-06-27 NOTE — ED Notes (Signed)
States no relief of pain following medication as ordered . VS remain as before, HR 48-50BPM. Patient states pain is 10/10.

## 2015-06-27 NOTE — ED Notes (Signed)
Pain recurred 10/10 on right flank area. Provider made aware. New orders received and patient medicated as ordered with moderate relief of pain to 4/10 on pain scale. Family at bedside.

## 2015-06-27 NOTE — ED Notes (Signed)
Awaits further eval by provider. Mother at bedside. Pain continues  4/10.

## 2015-06-27 NOTE — ED Notes (Signed)
I have reviewed discharge instructions with the patient.  The patient verbalized understanding. Patient armband removed and shredded

## 2015-06-27 NOTE — ED Triage Notes (Signed)
Patient reports right flank pain, expresses concern for kidney stone. Pt reports hx of kidney stones

## 2015-07-04 NOTE — Progress Notes (Signed)
Rcvd req for airstrip appt from Dr Bonney RousselSirohi. Called the patient and attempted to offer him an appt. However, he said he will have to call me back bec he is going out of town tomorrow and he does not know when he will be back.

## 2016-04-02 ENCOUNTER — Encounter (HOSPITAL_COMMUNITY): Payer: Self-pay

## 2016-04-02 ENCOUNTER — Emergency Department (HOSPITAL_COMMUNITY)
Admission: EM | Admit: 2016-04-02 | Discharge: 2016-04-02 | Disposition: A | Discharge status: Home / Self Care | Payer: Self-pay | Attending: Emergency Medicine | Admitting: Emergency Medicine

## 2016-04-02 ENCOUNTER — Emergency Department (HOSPITAL_COMMUNITY): Payer: Self-pay

## 2016-04-02 DIAGNOSIS — J069 Acute upper respiratory infection, unspecified: Secondary | ICD-10-CM

## 2016-04-02 DIAGNOSIS — J111 Influenza due to unidentified influenza virus with other respiratory manifestations: Secondary | ICD-10-CM | POA: Insufficient documentation

## 2016-04-02 LAB — CBC WITH DIFFERENTIAL/PLATELET
Basophils Absolute: 0 10*3/uL (ref 0.0–0.1)
Basophils Relative: 0 %
EOS ABS: 0 10*3/uL (ref 0.0–0.7)
Eosinophils Relative: 0 %
HCT: 40.9 % (ref 39.0–52.0)
Hemoglobin: 14 g/dL (ref 13.0–17.0)
LYMPHS ABS: 0.7 10*3/uL (ref 0.7–4.0)
LYMPHS PCT: 11 %
MCH: 31.3 pg (ref 26.0–34.0)
MCHC: 34.2 g/dL (ref 30.0–36.0)
MCV: 91.5 fL (ref 78.0–100.0)
MONO ABS: 1.2 10*3/uL — AB (ref 0.1–1.0)
MONOS PCT: 21 %
Neutro Abs: 4.1 10*3/uL (ref 1.7–7.7)
Neutrophils Relative %: 68 %
Platelets: 199 10*3/uL (ref 150–400)
RBC: 4.47 MIL/uL (ref 4.22–5.81)
RDW: 13.2 % (ref 11.5–15.5)
WBC: 6 10*3/uL (ref 4.0–10.5)

## 2016-04-02 LAB — COMPREHENSIVE METABOLIC PANEL
ALT: 17 U/L (ref 17–63)
ANION GAP: 10 (ref 5–15)
AST: 18 U/L (ref 15–41)
Albumin: 4.2 g/dL (ref 3.5–5.0)
Alkaline Phosphatase: 43 U/L (ref 38–126)
BUN: 9 mg/dL (ref 6–20)
CHLORIDE: 103 mmol/L (ref 101–111)
CO2: 25 mmol/L (ref 22–32)
CREATININE: 1.19 mg/dL (ref 0.61–1.24)
Calcium: 9.8 mg/dL (ref 8.9–10.3)
Glucose, Bld: 104 mg/dL — ABNORMAL HIGH (ref 65–99)
POTASSIUM: 3.4 mmol/L — AB (ref 3.5–5.1)
SODIUM: 138 mmol/L (ref 135–145)
Total Bilirubin: 0.7 mg/dL (ref 0.3–1.2)
Total Protein: 6.8 g/dL (ref 6.5–8.1)

## 2016-04-02 LAB — INFLUENZA PANEL BY PCR (TYPE A & B)
INFLAPCR: POSITIVE — AB
INFLBPCR: NEGATIVE

## 2016-04-02 MED ORDER — SODIUM CHLORIDE 0.9 % IV BOLUS (SEPSIS)
1000.0000 mL | Freq: Once | INTRAVENOUS | Status: AC
  Administered 2016-04-02: 1000 mL via INTRAVENOUS

## 2016-04-02 MED ORDER — IBUPROFEN 400 MG PO TABS
600.0000 mg | ORAL_TABLET | Freq: Once | ORAL | Status: AC
  Administered 2016-04-02: 600 mg via ORAL
  Filled 2016-04-02: qty 1

## 2016-04-02 MED ORDER — ACETAMINOPHEN 500 MG PO TABS
1000.0000 mg | ORAL_TABLET | Freq: Once | ORAL | Status: AC
  Administered 2016-04-02: 1000 mg via ORAL
  Filled 2016-04-02: qty 2

## 2016-04-02 MED ORDER — PSEUDOEPHEDRINE HCL ER 120 MG PO TB12
120.0000 mg | ORAL_TABLET | Freq: Two times a day (BID) | ORAL | Status: DC
  Administered 2016-04-02: 120 mg via ORAL
  Filled 2016-04-02 (×3): qty 1

## 2016-04-02 MED ORDER — SODIUM CHLORIDE 0.9 % IV BOLUS (SEPSIS): 1000.0000 mL | Freq: Once | INTRAVENOUS | Status: DC

## 2016-04-02 NOTE — ED Provider Notes (Signed)
[facility]-EMERGENCY DEPT Provider Note   CSN: [id number] Arrival date & time: 04/02/16  1424  By signing my name below, I, [name], attest that this documentation has been prepared under the direction and in the presence of Heide Scaleshristopher J Tegeler, MD. Electronically Signed: [name], ED Scribe. 04/02/16. 5:14 PM.  History   Chief Complaint Chief Complaint  Patient presents with  . Generalized Body Aches  . Chills    HPI Gavin Simpson is a 46 y.o. male who presents to the Emergency Department complaining of generalized body aches onset 2 days ago worsening yesterday. He is having associated symptoms of fatigue, chills, cough, nasal congestion, rhinorrhea, 40 lb weight loss in the past two years. He describes his symptoms acutely worsening over the last 24 hours. He hasn't tried any medications for the relief of his symptoms. He denies dysuria, constipation, diarrhea, nausea, vomiting, abdominal pain, leg pain, leg swelling, rash, and any other symptoms. Pt denies sick contacts at this time. He says that nothing has made it better or worse.    The history is provided by the patient. No language interpreter was used.    History reviewed. No pertinent past medical history.  There are no active problems to display for this patient.   History reviewed. No pertinent surgical history.     Home Medications    Prior to Admission medications   Not on File    Family History No family history on file.  Social History Social History  Substance Use Topics  . Smoking status: Never Smoker  . Smokeless tobacco: Never Used  . Alcohol use Yes     Comment: occasionally      Allergies   Patient has no known allergies.   Review of Systems Review of Systems  Constitutional: Positive for chills, fatigue and unexpected weight change.  HENT: Positive for congestion.   Cardiovascular: Negative for leg swelling.  Gastrointestinal: Negative for constipation, diarrhea, nausea and  vomiting.  Genitourinary: Negative for dysuria.  Musculoskeletal: Positive for myalgias. Negative for arthralgias.  Skin: Negative for rash.     Physical Exam Updated Vital Signs BP 137/89   Pulse 88   Temp 98.9 F (37.2 C) (Oral)   Resp 18   Ht 6\' 1"  (1.854 m)   Wt 170 lb (77.1 kg)   SpO2 100%   BMI 22.43 kg/m   Physical Exam  Constitutional: He is oriented to person, place, and time. He appears well-developed and well-nourished. No distress.  HENT:  Head: Normocephalic and atraumatic.  Right Ear: External ear normal.  Left Ear: External ear normal.  Nose: Nose normal.  Mouth/Throat: Oropharynx is clear and moist. No oropharyngeal exudate.  Eyes: Conjunctivae and EOM are normal. Pupils are equal, round, and reactive to light.  Neck: Normal range of motion. Neck supple.  Cardiovascular: Normal rate, regular rhythm and normal heart sounds.  Exam reveals no gallop and no friction rub.   No murmur heard. Pulmonary/Chest: Effort normal and breath sounds normal. No stridor. No respiratory distress. He has no wheezes. He has no rales.  Abdominal: Soft. There is no tenderness. There is no rebound and no guarding.  Musculoskeletal: He exhibits no edema.  Neurological: He is alert and oriented to person, place, and time. He displays normal reflexes. No cranial nerve deficit. He exhibits normal muscle tone. Coordination normal.  Skin: Skin is warm. No rash noted. He is not diaphoretic. No erythema.    ED Treatments / Results  DIAGNOSTIC STUDIES: Oxygen Saturation is 100% on  RA, nl by my interpretation.    COORDINATION OF CARE: 5:12 PM Discussed treatment plan with pt at bedside which includes CXR, labs, flu swab, IV fluids, and pt agreed to plan.   Labs (all labs ordered are listed, but only abnormal results are displayed) Labs Reviewed  CBC WITH DIFFERENTIAL/PLATELET - Abnormal; Notable for the following:       Result Value   Monocytes Absolute 1.2 (*)    All other  components within normal limits  COMPREHENSIVE METABOLIC PANEL - Abnormal; Notable for the following:    Potassium 3.4 (*)    Glucose, Bld 104 (*)    All other components within normal limits  INFLUENZA PANEL BY PCR (TYPE A & B) - Abnormal; Notable for the following:    Influenza A By PCR POSITIVE (*)    All other components within normal limits    Radiology Dg Chest 2 View  Result Date: 04/02/2016 CLINICAL DATA:  Chills, body aches, congestion EXAM: CHEST  2 VIEW COMPARISON:  None. FINDINGS: Cardiomediastinal silhouette is unremarkable. No infiltrate or pleural effusion. No pulmonary edema. Mild degenerative changes mid thoracic spine. IMPRESSION: No active cardiopulmonary disease. Electronically Signed   By: Natasha Mead M.D.   On: 04/02/2016 15:10    Procedures Procedures (including critical care time)  Medications Ordered in ED Medications  sodium chloride 0.9 % bolus 1,000 mL ( Intravenous Canceled Entry 04/02/16 2148)  pseudoephedrine (SUDAFED) 12 hr tablet 120 mg (120 mg Oral Given 04/02/16 2133)  sodium chloride 0.9 % bolus 1,000 mL (0 mLs Intravenous Stopped 04/02/16 2148)  acetaminophen (TYLENOL) tablet 1,000 mg (1,000 mg Oral Given 04/02/16 1833)  ibuprofen (ADVIL,MOTRIN) tablet 600 mg (600 mg Oral Given 04/02/16 2134)     Initial Impression / Assessment and Plan / ED Course  I have reviewed the triage vital signs and the nursing notes.  Pertinent labs & imaging results that were available during my care of the patient were reviewed by me and considered in my medical decision making (see chart for details).  Clinical Course    Gavin Simpson is a 46 y.o. male who presents to the Emergency Department complaining of generalized body aches, fatigue, chills, productive cough, nasal congestion, rhinorrhea, 40 lb weight loss in the past two years.   History and exam are seen above.  On exam, patient had audible congestion and visible rhinorrhea. No posterior oropharyngeal  erythema. Full range of motion of his neck without rigidity. Lungs were clear and chest was nontender. Abdomen nontender. No rashes were seen and legs were unremarkable. Patient did have shaking chills during initial examination.  As patient symptoms have changed and worsened over the last 24 hours, patient will have influenza swab performed. Patient also had chest x-ray and laboratory testing given his 40 pound weight loss in the last year and his fatigue and myalgias to look for significant abnormalities or dehydration. Patient reports feeling dehydrated. He'll be given fluids during the initial workup.   Patient continue to feel Congested and Have a call quality. Patient requested decongestant incidentals were.Laboratory testing returns showing Grossly Unremarkable lab testing. Chest x-ray showed no pneumonia.  Given length of stay, patient requested discharged while the test is still pending. Patient follow-up with a PCP for further management and returned if any symptoms worsened. Patient informed that he will be called if the test is positive and Tamiflu will be called into his pharmacy is positive due to his short duration of symptoms.   Patient family understood plan of  care and patient was discharged in good condition    12:04 AM After patient left to ED,Influenza test returned positive. Patient is positive for influenza A. As outlined in the plan, Tamiflu was sent to his pharmacy. Patient's wife was called By me and informed of this finding. She answered and agreed to plan of picking up Tamiflu at the pharmacy.  She continued to understand return precautions and follow plans.     Final Clinical Impressions(s) / ED Diagnoses   Final diagnoses:  Upper respiratory tract infection, unspecified type  Influenza    New Prescriptions There are no discharge medications for this patient.  I personally performed the services described in this documentation, which was scribed in my  presence. The recorded information has been reviewed and is accurate.   Clinical Impression: 1. Upper respiratory tract infection, unspecified type   2. Influenza     Disposition: Discharge  Condition: Good  I have discussed the results, Dx and Tx plan with the pt(& family if present). He/she/they expressed understanding and agree(s) with the plan. Discharge instructions discussed at great length. Strict return precautions discussed and pt &/or family have verbalized understanding of the instructions. No further questions at time of discharge.    There are no discharge medications for this patient.   Follow Up: [facility] [address] [place] [address] [phone]    [facility] EMERGENCY DEPARTMENT [address] [id number] [place] [address] [phone]  If symptoms worsen        Heide Scales, MD 04/03/16 [phone]

## 2016-04-02 NOTE — Discharge Instructions (Signed)
Your chest x-ray was negative for pneumonia. Your influenza testing is in process however, you likely have a viral upper respiratory infection. Please try and stay hydrated. Please follow-up with a primary care physician for further management of your symptoms. If anything worsens, please return to the nearest emergency department. You can take an oral decongestant over-the-counter to help with the congestion you are experiencing. Please continue handwashing as your infection is likely contagious at this time.

## 2016-04-02 NOTE — ED Triage Notes (Signed)
Per Pt, Pt is coming from home with complaints of body aches, chills, and congestion x2 days. Pt reports being around his mother who was sick.

## 2016-04-03 MED ORDER — OSELTAMIVIR PHOSPHATE 75 MG PO CAPS: 75.0000 mg | ORAL_CAPSULE | Freq: Two times a day (BID) | ORAL | 0 refills | Status: AC

## 2017-12-04 ENCOUNTER — Inpatient Hospital Stay
Admit: 2017-12-04 | Discharge: 2017-12-04 | Disposition: A | Discharge status: Home / Self Care | Payer: Self-pay | Attending: Emergency Medicine

## 2017-12-04 DIAGNOSIS — M25512 Pain in left shoulder: Secondary | ICD-10-CM

## 2017-12-04 MED ORDER — PREDNISONE 10 MG TABLETS IN A DOSE PACK
10 mg | ORAL_TABLET | ORAL | 0 refills | Status: DC
Start: 2017-12-04 — End: 2018-02-02

## 2017-12-04 MED ORDER — NAPROXEN 500 MG TAB
500 mg | ORAL_TABLET | Freq: Two times a day (BID) | ORAL | 0 refills | Status: DC
Start: 2017-12-04 — End: 2018-02-02

## 2017-12-04 NOTE — ED Provider Notes (Signed)
ED Provider Notes by Joaquim Lai, PA at 12/04/17 1047                Author: Joaquim Lai, PA  Service: --  Author Type: Physician Assistant       Filed: 12/04/17 1115  Date of Service: 12/04/17 1047  Status: Attested Addendum          Editor: Joaquim Lai, PA (Physician Assistant)       Related Notes: Original Note by Joaquim Lai, PA (Physician Assistant) filed at 12/04/17  1051          Cosigner: Angelena Sole, DO at 12/04/17 2030          Attestation signed by Angelena Sole, DO at 12/04/17 2030          I was personally available for consultation in the emergency department.  I have reviewed the chart prior to the patient being discharged and agree with the  documentation recorded by the [facility], including the assessment, treatment plan, and disposition.   Angelena Sole, DO                                  EMERGENCY DEPARTMENT HISTORY AND PHYSICAL EXAM           Date: 12/04/2017   Patient Name: Tim Peterson        History of Presenting Illness          Chief Complaint       Patient presents with        ?  Shoulder Tim           History Provided By: Patient      HPI: Tim Peterson,  47 y.o. male PMHx significant for kidney stones presents ambulatory to the ED with cc of  intermittent sharp and aching left shoulder Tim, made worse with overhead movement x 2 weeks. Denies blunt trauma or injury. 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 Denies numbness and weakness. Pt reports intermittently  fingers "are tingly". Pt has been taking ibuprofen without relief of sx. Denies previous shoulder problems. Pt right-handed. Rates Tim 7/10. Denies CP, SOB, dizziness. Denies cardiac hx.       There are no other complaints, changes, or physical findings at this time.      PCP: None        No current facility-administered medications on file prior to encounter.           Current Outpatient Medications on File Prior to Encounter          Medication  Sig  Dispense   Refill           ?  traMADol (ULTRAM) 50 mg tablet  Take 1 Tab by mouth every six (6) hours as needed for Tim. Max Daily Amount: 200 mg.  30 Tab  0           ?  ondansetron (ZOFRAN ODT) 4 mg disintegrating tablet  Take 1 Tab by mouth every eight (8) hours as needed for Nausea.  10 Tab  0             Past History        Past Medical History:     Past Medical History:        Diagnosis  Date         ?  Kidney stones  Past Surgical History:     Past Surgical History:         Procedure  Laterality  Date          ?  HX ORTHOPAEDIC              left leg pins           Family History:   History reviewed. No pertinent family history.      Social History:     Social History          Tobacco Use         ?  Smoking status:  Unknown If Ever Smoked     ?  Smokeless tobacco:  Never Used       Substance Use Topics         ?  Alcohol use:  No             Comment: occasional         ?  Drug use:  No              Types:  Marijuana           Allergies:   No Known Allergies           Review of Systems     Review of Systems    Constitutional: Negative for chills and fever.    HENT: Negative for facial swelling.     Eyes: Negative for photophobia and visual disturbance.    Respiratory: Negative for shortness of breath.     Cardiovascular: Negative for chest Tim.    Gastrointestinal: Negative for abdominal Tim, nausea and vomiting.    Genitourinary: Negative for flank Tim.    Musculoskeletal: Positive for arthralgias (left shoulder Tim) .    Skin: Negative for color change, pallor, rash and wound.    Neurological: Negative for dizziness, weakness, light-headedness and headaches.    All other systems reviewed and are negative.           Physical Exam     Physical Exam    Constitutional: He is oriented to person, place, and time. He appears well-developed and well-nourished. No distress.    HENT:    Head: Normocephalic and atraumatic.    Eyes: Conjunctivae are normal.    Cardiovascular: Normal rate, regular rhythm and normal  heart sounds.   No murmurs, rubs or gallops    Pulmonary/Chest: Effort normal and breath sounds normal. No respiratory distress.    Abdominal: Soft. Bowel sounds are normal. There is no tenderness.   Musculoskeletal:        Left shoulder: He exhibits tenderness  (Tim worse wtih overhead movement, Tim reproducible on exam, no erythema warmth or swelling ). He exhibits normal range of motion, no bony tenderness, no Tim and no spasm.    Radial pulses strong and equal b/l   Neurological: He is alert and oriented to  person, place, and time. No cranial nerve deficit.    Skin: Skin is warm. No rash noted.   Psychiatric: He has a normal mood and affect. His behavior is normal.    Nursing note and vitals reviewed.           Diagnostic Study Results        Labs -    No results found for this or any previous visit (from the past 12 hour(s)).      Radiologic Studies -      No orders to display  CT Results   (Last 48 hours)          None                 CXR Results   (Last 48 hours)          None                    Medical Decision Making     I am the first provider for this patient.      I reviewed the vital signs, available nursing notes, past medical history, past surgical history, family history and social history.      Vital Signs-Reviewed the patient's vital signs.   Patient Vitals for the past 12 hrs:            Temp  Pulse  Resp  BP  SpO2            12/04/17 1036  98.4 ??F (36.9 ??C)  72  18  (!) 123/91  99 %           Records Reviewed: Nursing Notes and Old Medical Records      Provider Notes (Medical Decision Making):    DDx: Shoulder bursitis vs arthritis vs RTC tear      No xray ordered due to lack of blunt trauma or injury. No EKG ordered because Tim reproducible on exam. Vital signs stable. Denies CP, SOB, dizziness. No previous cardiac hx.       ED Course:    Initial assessment performed. The patients presenting problems have been discussed, and they are in agreement with the care plan formulated and  outlined with them.  I have encouraged them to ask questions as they arise throughout their visit.                Disposition:   10:51 AM   Discussed dx and treatment plan. Discussed importance of ortho follow up. All questions answered. Pt voiced they understood. Return if sx worsen.          PLAN:   1.      Current Discharge Medication List              START taking these medications          Details        predniSONE (STERAPRED DS) 10 mg dose pack  Take as instructed on pack   Qty: 48 Tab, Refills:  0               naproxen (NAPROSYN) 500 mg tablet  Take 1 Tab by mouth two (2) times daily (with meals).   Qty: 20 Tab, Refills:  0                      2.      Follow-up Information               Follow up With  Specialties  Details  Why  Contact Info              [facility]  Schedule an appointment as soon as possible for a visit in 1 day    [address]   [place] [id number]   [phone]             Return to ED if worse         Diagnosis  Clinical Impression:       1.  Tim in joint of left shoulder            Attestations:      Joaquim Lai, PA      Please note that this dictation was completed with Dragon, the computer voice recognition software.  Quite often unanticipated grammatical, syntax, homophones, and other interpretive errors are  inadvertently transcribed by the computer software.  Please disregard these errors.  Please excuse any errors that have escaped final proofreading.  Thank you.

## 2017-12-04 NOTE — ED Notes (Signed)
The pt presents with a CC of left shoulder pain he stated he was helping his brother install a otor 2 weeks ago and it has been hurting since. He stated that he is experiencing the pain it is worst with position changes and his fingers are going numb. The pt is not reporting any SOB Nausea or Vomiting.

## 2017-12-04 NOTE — ED Provider Notes (Addendum)
EMERGENCY DEPARTMENT HISTORY AND PHYSICAL EXAM      Date: 12/04/2017  Patient Name: Tim Peterson    History of Presenting Illness     Chief Complaint   Patient presents with   ??? Shoulder Pain       History Provided By: Patient    HPI: Tim Peterson, 47 y.o. male PMHx significant for kidney stones presents ambulatory to the ED with cc of intermittent sharp and aching left shoulder pain, made worse with overhead movement x 2 weeks. Denies blunt trauma or injury. 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 Denies numbness and weakness. Pt reports intermittently fingers "are tingly". Pt has been taking ibuprofen without relief of sx. Denies previous shoulder problems. Pt right-handed. Rates pain 7/10. Denies CP, SOB, dizziness. Denies cardiac hx.     There are no other complaints, changes, or physical findings at this time.    PCP: None    No current facility-administered medications on file prior to encounter.      Current Outpatient Medications on File Prior to Encounter   Medication Sig Dispense Refill   ??? traMADol (ULTRAM) 50 mg tablet Take 1 Tab by mouth every six (6) hours as needed for Pain. Max Daily Amount: 200 mg. 30 Tab 0   ??? ondansetron (ZOFRAN ODT) 4 mg disintegrating tablet Take 1 Tab by mouth every eight (8) hours as needed for Nausea. 10 Tab 0       Past History     Past Medical History:  Past Medical History:   Diagnosis Date   ??? Kidney stones        Past Surgical History:  Past Surgical History:   Procedure Laterality Date   ??? HX ORTHOPAEDIC      left leg pins       Family History:  History reviewed. No pertinent family history.    Social History:  Social History     Tobacco Use   ??? Smoking status: Unknown If Ever Smoked   ??? Smokeless tobacco: Never Used   Substance Use Topics   ??? Alcohol use: No     Comment: occasional   ??? Drug use: No     Types: Marijuana       Allergies:  No Known Allergies      Review of Systems   Review of Systems    Constitutional: Negative for chills and fever.   HENT: Negative for facial swelling.    Eyes: Negative for photophobia and visual disturbance.   Respiratory: Negative for shortness of breath.    Cardiovascular: Negative for chest pain.   Gastrointestinal: Negative for abdominal pain, nausea and vomiting.   Genitourinary: Negative for flank pain.   Musculoskeletal: Positive for arthralgias (left shoulder pain).   Skin: Negative for color change, pallor, rash and wound.   Neurological: Negative for dizziness, weakness, light-headedness and headaches.   All other systems reviewed and are negative.      Physical Exam   Physical Exam   Constitutional: He is oriented to person, place, and time. He appears well-developed and well-nourished. No distress.   HENT:   Head: Normocephalic and atraumatic.   Eyes: Conjunctivae are normal.   Cardiovascular: Normal rate, regular rhythm and normal heart sounds.   No murmurs, rubs or gallops   Pulmonary/Chest: Effort normal and breath sounds normal. No respiratory distress.   Abdominal: Soft. Bowel sounds are normal. There is no tenderness.   Musculoskeletal:        Left shoulder: He  exhibits tenderness (pain worse wtih overhead movement, pain reproducible on exam, no erythema warmth or swelling ). He exhibits normal range of motion, no bony tenderness, no pain and no spasm.   Radial pulses strong and equal b/l   Neurological: He is alert and oriented to person, place, and time. No cranial nerve deficit.   Skin: Skin is warm. No rash noted.   Psychiatric: He has a normal mood and affect. His behavior is normal.   Nursing note and vitals reviewed.      Diagnostic Study Results     Labs -   No results found for this or any previous visit (from the past 12 hour(s)).    Radiologic Studies -   No orders to display     CT Results  (Last 48 hours)    None        CXR Results  (Last 48 hours)    None          Medical Decision Making   I am the first provider for this patient.     I reviewed the vital signs, available nursing notes, past medical history, past surgical history, family history and social history.    Vital Signs-Reviewed the patient's vital signs.  Patient Vitals for the past 12 hrs:   Temp Pulse Resp BP SpO2   12/04/17 1036 98.4 ??F (36.9 ??C) 72 18 (!) 123/91 99 %       Records Reviewed: Nursing Notes and Old Medical Records    Provider Notes (Medical Decision Making):   DDx: Shoulder bursitis vs arthritis vs RTC tear    No xray ordered due to lack of blunt trauma or injury. No EKG ordered because pain reproducible on exam. Vital signs stable. Denies CP, SOB, dizziness. No previous cardiac hx.     ED Course:   Initial assessment performed. The patients presenting problems have been discussed, and they are in agreement with the care plan formulated and outlined with them.  I have encouraged them to ask questions as they arise throughout their visit.           Disposition:  10:51 AM  Discussed dx and treatment plan. Discussed importance of ortho follow up. All questions answered. Pt voiced they understood. Return if sx worsen.       PLAN:  1.   Current Discharge Medication List      START taking these medications    Details   predniSONE (STERAPRED DS) 10 mg dose pack Take as instructed on pack  Qty: 48 Tab, Refills: 0      naproxen (NAPROSYN) 500 mg tablet Take 1 Tab by mouth two (2) times daily (with meals).  Qty: 20 Tab, Refills: 0           2.   Follow-up Information     Follow up With Specialties Details Why Contact Info    [facility] Schedule an appointment as soon as possible for a visit in 1 day  [address]  [place] [id number]  [phone]        Return to ED if worse     Diagnosis     Clinical Impression:   1. Pain in joint of left shoulder        Attestations:    Joaquim LaiElizabeth A Angell Pincock, PA    Please note that this dictation was completed with Dragon, the computer  voice recognition software.  Quite often unanticipated  grammatical, syntax, homophones, and other interpretive errors are inadvertently transcribed by the computer software.  Please disregard these errors.  Please excuse any errors that have escaped final proofreading.  Thank you.

## 2017-12-04 NOTE — ED Triage Notes (Signed)
The pt presents with a CC of left shoulder pain he stated he was helping his brother install a otor 2 weeks ago and it has been hurting since. He stated that he is experiencing the pain it is worst with position changes and his fingers are going numb. The pt is not reporting any SOB Nausea or Vomiting.

## 2017-12-04 NOTE — ED Notes (Signed)
I have reviewed discharge instructions with the patient.  The patient verbalized understanding. Pt  dc'd via ambulation with belongings. Patient armband removed and shredded.

## 2018-02-02 ENCOUNTER — Inpatient Hospital Stay
Admit: 2018-02-02 | Discharge: 2018-02-02 | Disposition: A | Discharge status: Home / Self Care | Payer: PRIVATE HEALTH INSURANCE | Attending: Emergency Medicine

## 2018-02-02 DIAGNOSIS — J069 Acute upper respiratory infection, unspecified: Secondary | ICD-10-CM

## 2018-02-02 MED ORDER — SODIUM CHLORIDE 0.9 % NASAL SPRAY AEROSOL
0.9 % | NASAL | 0 refills | Status: DC | PRN
Start: 2018-02-02 — End: 2018-04-01

## 2018-02-02 MED ORDER — BENZONATATE 100 MG CAP
100 mg | ORAL_CAPSULE | Freq: Three times a day (TID) | ORAL | 0 refills | Status: AC | PRN
Start: 2018-02-02 — End: 2018-02-09

## 2018-02-02 NOTE — ED Provider Notes (Signed)
ED Provider Notes by Nicolette Bang, PA at 02/02/18 1649                Author: Nicolette Bang, PA  Service: EMERGENCY  Author Type: Physician Assistant       Filed: 02/02/18 1653  Date of Service: 02/02/18 1649  Status: Attested           Editor: Nicolette Bang, PA (Physician Assistant)  Cosigner: Reuel Derby, MD at 02/02/18 1655          Attestation signed by Reuel Derby, MD at 02/02/18 1655          I was personally available for consultation in the emergency department. I have reviewed the chart prior to the patient's discharge and agree with  the documentation recorded by the [facility], including the assessment, treatment plan, and disposition.                                    [place]   [facility]         Date: 02/02/2018   Patient Name: Tim Peterson        History of Presenting Illness          Chief Complaint       Patient presents with        ?  Blister     ?  Letter for School/Work        ?  Nasal Congestion           47 y.o. male presents the ED complaining  of URI symptoms for the past 6 days.  Patient notes having nasal congestion, cough, and a slight sore throat.  He states that his cough brings up clear sputum.  He notes having a known sick contact.  States that he is much improved, almost better, and  needing a work note today.  Lastly, he has a cold sore to his left lower lip for which he bought Abreva.  Denies any fever, chills, shortness of breath, chest pain, hemoptysis, other symptoms at this time.      Patient denies any other associated signs or symptoms.  Patient denies any other complaints.      Nursing notes regarding the HPI and triage nursing notes were reviewed.       Prior medical records were reviewed.         Current Outpatient Medications          Medication  Sig  Dispense  Refill           ?  benzonatate (TESSALON PERLES) 100 mg capsule  Take 1 Cap by mouth three (3) times daily as needed for Cough for up to 7 days.  30 Cap  0           ?  sodium  chloride (NASAL MIST) 0.9 % spra  1 Spray by Nasal route as needed (congestion).  126 mL  0             Past History        Past Medical History:     Past Medical History:        Diagnosis  Date         ?  Kidney stones             Past Surgical History:     Past Surgical History:  Procedure  Laterality  Date          ?  HX ORTHOPAEDIC              left leg pins           Family History:   History reviewed. No pertinent family history.      Social History:     Social History          Tobacco Use         ?  Smoking status:  Never Smoker     ?  Smokeless tobacco:  Never Used       Substance Use Topics         ?  Alcohol use:  No             Comment: occasional         ?  Drug use:  Yes              Types:  Marijuana           Allergies:   No Known Allergies      Patient's primary care provider (as noted in EPIC):  None      Review of Systems    Constitutional:  Denies malaise, fever, chills.    ENMT:  + nasal congestion, slight sore throat.    Cardiac:  Denies chest pain or palpitations.    Respiratory: + cough with clear sputum. Denies wheezing, difficulty breathing, shortness of breath.    Neuro:  Denies headache, LOC, dizziness, neurologic symptoms/deficits/paresthesias.    Skin: Denies injury, rash, itching or skin changes.   All other systems negative as reviewed.       Visit Vitals      BP  118/87 (BP 1 Location: Left arm, BP Patient Position: At rest)     Pulse  84     Temp  98.7 ??F (37.1 ??C)     Resp  18     Ht  6\' 1"  (1.854 m)     Wt  79.8 kg (176 lb)     SpO2  98%        BMI  23.22 kg/m??           PHYSICAL EXAM:      CONSTITUTIONAL:  Alert, in no apparent distress;  well developed;  well nourished.   HEAD:  Normocephalic, atraumatic.   EYES:  EOMI.  Non-icteric sclera.  Normal conjunctiva.   ENTM:  Nose:  Nasal turbinates erythematous and edematous, clear rhinorrhea.  Throat:  no erythema or exudate, mucous membranes moist.   Uvula midline, airway patent.    NECK:  Supple   RESPIRATORY:  Chest clear,  equal breath sounds, good air movement.  Without wheezes, rhonchi or rales.    CARDIOVASCULAR:  Regular rate and rhythm.  No murmurs,  rubs, or gallops.   UPPER EXT:  Normal inspection.   LOWER EXT:  No edema, no calf tenderness.  Distal pulses intact.   NEURO:  Moves all four extremities, and grossly normal motor exam.   SKIN:  No rashes;  Normal for age.   PSYCH:  Alert and normal affect.      DIFFERENTIAL DIAGNOSES/ MEDICAL DECISION MAKING:   Viral upper respiratory infection, acute bronchitis, influenza/ flu, pneumonia, other etiologies versus a combination of the above (ex.  uri on top of pneumonia).   No sing of pneumonia, lung sounds are clear, sputum is clear, vitals look well.  Pt states he is  almost better, just needing a work note today.  Declined any chest xray today.  Will f/u with PCP.       Diagnosis:       1.  URI with cough and congestion         Disposition: Discharge        Follow-up Information               Follow up With  Specialties  Details  Why  Contact Info              [facility]    In 3 days    [address] [place], [address]   [place] [address]   [phone]              [facility]  Emergency Medicine    If symptoms worsen  [address] [place] [address]   [phone]                  Patient's Medications       Start Taking           BENZONATATE (TESSALON PERLES) 100 MG CAPSULE     Take 1 Cap by mouth three (3) times daily as needed for Cough for up to 7 days.           SODIUM CHLORIDE (NASAL MIST) 0.9 % SPRA     1 Spray by Nasal route as needed (congestion).       Continue Taking          No medications on file       These Medications have changed          No medications on file       Stop Taking           NAPROXEN (NAPROSYN) 500 MG TABLET     Take 1 Tab by mouth two (2) times daily (with meals).           ONDANSETRON (ZOFRAN ODT) 4 MG DISINTEGRATING TABLET     Take 1 Tab by mouth every eight (8) hours as needed for Nausea.            PREDNISONE (STERAPRED DS) 10 MG DOSE PACK     Take as instructed on pack           TRAMADOL (ULTRAM) 50 MG TABLET     Take 1 Tab by mouth every six (6) hours as needed for Pain. Max Daily Amount: 200 mg.        Nicolette Bang, PA

## 2018-02-02 NOTE — ED Notes (Signed)
Patient c/o blister to lip.  He states having cold symptoms since Friday.  C/o nasal congestion.  Requires return to work note.

## 2018-02-02 NOTE — ED Notes (Signed)
Discharge instructions reviewed with patient.  Patient voices understanding.  Patient advised to follow up as directed on discharge instructions.  Patient denies questions, needs or concerns at discharge regarding discharge instructions.  Patient voiced understanding.   No s/sx of distress noted.  Armband removed and placed in shredder box.

## 2018-02-02 NOTE — ED Triage Notes (Signed)
Patient c/o blister to lip.  He states having cold symptoms since Friday.  C/o nasal congestion.  Requires return to work note.

## 2018-02-02 NOTE — ED Provider Notes (Signed)
[facility]      Date: 02/02/2018  Patient Name: [facility]    History of Presenting Illness     Chief Complaint   Patient presents with   ??? Blister   ??? Letter for School/Work   ??? Nasal Congestion       47 y.o. male presents the ED complaining of URI symptoms for the past 6 days.  Patient notes having nasal congestion, cough, and a slight sore throat.  He states that his cough brings up clear sputum.  He notes having a known sick contact.  States that he is much improved, almost better, and needing a work note today.  Lastly, he has a cold sore to his left lower lip for which he bought Abreva.  Denies any fever, chills, shortness of breath, chest pain, hemoptysis, other symptoms at this time.    Patient denies any other associated signs or symptoms.  Patient denies any other complaints.    Nursing notes regarding the HPI and triage nursing notes were reviewed.     Prior medical records were reviewed.     Current Outpatient Medications   Medication Sig Dispense Refill   ??? benzonatate (TESSALON PERLES) 100 mg capsule Take 1 Cap by mouth three (3) times daily as needed for Cough for up to 7 days. 30 Cap 0   ??? sodium chloride (NASAL MIST) 0.9 % spra 1 Spray by Nasal route as needed (congestion). 126 mL 0       Past History     Past Medical History:  Past Medical History:   Diagnosis Date   ??? Kidney stones        Past Surgical History:  Past Surgical History:   Procedure Laterality Date   ??? HX ORTHOPAEDIC      left leg pins       Family History:  History reviewed. No pertinent family history.    Social History:  Social History     Tobacco Use   ??? Smoking status: Never Smoker   ??? Smokeless tobacco: Never Used   Substance Use Topics   ??? Alcohol use: No     Comment: occasional   ??? Drug use: Yes     Types: Marijuana       Allergies:  No Known Allergies    Patient's primary care provider (as noted in EPIC):  None    Review of Systems   Constitutional:  Denies malaise, fever, chills.    ENMT:  + nasal congestion, slight sore throat.   Cardiac:  Denies chest pain or palpitations.   Respiratory: + cough with clear sputum. Denies wheezing, difficulty breathing, shortness of breath.   Neuro:  Denies headache, LOC, dizziness, neurologic symptoms/deficits/paresthesias.   Skin: Denies injury, rash, itching or skin changes.  All other systems negative as reviewed.     Visit Vitals  BP 118/87 (BP 1 Location: Left arm, BP Patient Position: At rest)   Pulse 84   Temp 98.7 ??F (37.1 ??C)   Resp 18   Ht 6\' 1"  (1.854 m)   Wt 79.8 kg (176 lb)   SpO2 98%   BMI 23.22 kg/m??       PHYSICAL EXAM:    CONSTITUTIONAL:  Alert, in no apparent distress;  well developed;  well nourished.  HEAD:  Normocephalic, atraumatic.  EYES:  EOMI.  Non-icteric sclera.  Normal conjunctiva.  ENTM:  Nose:  Nasal turbinates erythematous and edematous, clear rhinorrhea.  Throat:  no erythema or  exudate, mucous membranes moist.  Uvula midline, airway patent.   NECK:  Supple  RESPIRATORY:  Chest clear, equal breath sounds, good air movement.  Without wheezes, rhonchi or rales.   CARDIOVASCULAR:  Regular rate and rhythm.  No murmurs, rubs, or gallops.  UPPER EXT:  Normal inspection.  LOWER EXT:  No edema, no calf tenderness.  Distal pulses intact.  NEURO:  Moves all four extremities, and grossly normal motor exam.  SKIN:  No rashes;  Normal for age.  PSYCH:  Alert and normal affect.    DIFFERENTIAL DIAGNOSES/ MEDICAL DECISION MAKING:  Viral upper respiratory infection, acute bronchitis, influenza/ flu, pneumonia, other etiologies versus a combination of the above (ex. uri on top of pneumonia).  No sing of pneumonia, lung sounds are clear, sputum is clear, vitals look well.  Pt states he is almost better, just needing a work note today.  Declined any chest xray today.  Will f/u with PCP.     Diagnosis:   1. URI with cough and congestion      Disposition: Discharge    Follow-up Information     Follow up With Specialties Details Why Contact Info     [facility]  In 3 days  [address]  [place] [id number]  [phone]    [facility] Emergency Medicine  If symptoms worsen [address]  [place] [phone]  [phone]          Patient's Medications   Start Taking    BENZONATATE (TESSALON PERLES) 100 MG CAPSULE    Take 1 Cap by mouth three (3) times daily as needed for Cough for up to 7 days.    SODIUM CHLORIDE (NASAL MIST) 0.9 % SPRA    1 Spray by Nasal route as needed (congestion).   Continue Taking    No medications on file   These Medications have changed    No medications on file   Stop Taking    NAPROXEN (NAPROSYN) 500 MG TABLET    Take 1 Tab by mouth two (2) times daily (with meals).    ONDANSETRON (ZOFRAN ODT) 4 MG DISINTEGRATING TABLET    Take 1 Tab by mouth every eight (8) hours as needed for Nausea.    PREDNISONE (STERAPRED DS) 10 MG DOSE PACK    Take as instructed on pack    TRAMADOL (ULTRAM) 50 MG TABLET    Take 1 Tab by mouth every six (6) hours as needed for Pain. Max Daily Amount: 200 mg.     Nicolette BangAshlee L Cortni Tays, PA

## 2018-04-01 ENCOUNTER — Inpatient Hospital Stay
Admit: 2018-04-01 | Discharge: 2018-04-01 | Disposition: A | Discharge status: Home / Self Care | Payer: PRIVATE HEALTH INSURANCE | Attending: Emergency Medicine

## 2018-04-01 DIAGNOSIS — K0889 Other specified disorders of teeth and supporting structures: Secondary | ICD-10-CM

## 2018-04-01 MED ORDER — HYDROCODONE-ACETAMINOPHEN 5 MG-325 MG TAB
5-325 mg | ORAL_TABLET | Freq: Three times a day (TID) | ORAL | 0 refills | Status: AC | PRN
Start: 2018-04-01 — End: 2018-04-03

## 2018-04-01 MED ORDER — AMOXICILLIN 500 MG TABLET
500 mg | ORAL_TABLET | Freq: Three times a day (TID) | ORAL | 0 refills | Status: AC
Start: 2018-04-01 — End: 2018-04-11

## 2018-04-01 NOTE — ED Provider Notes (Signed)
ED Provider Notes by Michiel SitesBlair, Christna Kulick H, PA-C at 04/01/18 1343                Author: Michiel SitesBlair, Kiahna Banghart H, PA-C  Service: --  Author Type: Physician Assistant       Filed: 04/01/18 1412  Date of Service: 04/01/18 1343  Status: Attested           Editor: Era SkeenBlair, Talin Feister H, PA-C (Physician Assistant)  Cosigner: Angelena Soleews, Adrian, DO at 04/01/18 1429          Attestation signed by Angelena Soleews, Adrian, DO at 04/01/18 1429          I was personally available for consultation in the emergency department.  I have reviewed the chart prior to the patient being discharged and agree with the  documentation recorded by the [facility], including the assessment, treatment plan, and disposition.   Angelena SoleAdrian Dews, DO                                  EMERGENCY DEPARTMENT HISTORY AND PHYSICAL EXAM      1:43 PM         Date: 04/01/2018   Patient Name: Tim Peterson        History of Presenting Illness          Chief Complaint       Patient presents with        ?  Dental Pain        ?  Facial Swelling           History Provided By: Patient      Chief Complaint: Left upper dental pain, needs work note   Duration:  Hours   Timing:  Acute   Location:    Quality: Aching   Severity: 8 out of 10   Modifying Factors: Partially relieved by Aleve   Associated Symptoms: Left-sided facial swelling         Additional History (Context):Tim Peterson  is a 48 y.o. male with a pertinent  history of kidney stones, hypertension who presents to the emergency department for evaluation of left upper dental pain and left sided facial swelling upon awakening this morning.  Patient states he went to work, but had to leave due to pain and swelling.   When he got home, he took 2 Aleve and went to sleep.  When he awoke, he states the pain is somewhat better and the swelling has gone down, but now he needs a work note to return.  Patient denies any fevers or chills, nausea, vomiting, diarrhea, difficulty  breathing, difficulty swallowing, or any other complaints.         PCP:   None           Current Outpatient Medications          Medication  Sig  Dispense  Refill           ?  naproxen sodium (ALEVE) 220 mg cap  Take  by mouth.               ?  amoxicillin 500 mg tab  Take 500 mg by mouth three (3) times daily for 10 days.  30 Tab  0           ?  HYDROcodone-acetaminophen (NORCO) 5-325 mg per tablet  Take 1 Tab by mouth every eight (8) hours as needed for Pain for up to 2 days. Max  Daily Amount: 3 Tabs.  6 Tab  0             Past History        Past Medical History:     Past Medical History:        Diagnosis  Date         ?  Elevated blood pressure reading       ?  Kidney stones           ?  Pain in joint of right shoulder             Past Surgical History:     Past Surgical History:         Procedure  Laterality  Date          ?  HX ORTHOPAEDIC              left leg pins           Family History:   History reviewed. No pertinent family history.      Social History:     Social History          Tobacco Use         ?  Smoking status:  Never Smoker     ?  Smokeless tobacco:  Never Used       Substance Use Topics         ?  Alcohol use:  No             Comment: occasional         ?  Drug use:  Not Currently              Types:  Marijuana           Allergies:   No Known Allergies           Review of Systems           Review of Systems    Constitutional: Negative for chills and fever.    HENT: Positive for dental problem. Negative for congestion, rhinorrhea and sore throat.     Respiratory: Negative for cough and shortness of breath.     Cardiovascular: Negative for chest pain.    Gastrointestinal: Negative for abdominal pain, blood in stool, constipation, diarrhea, nausea and vomiting.    Genitourinary: Negative for dysuria, frequency and hematuria.    Musculoskeletal: Negative for back pain and myalgias.    Skin: Negative for rash and wound.    Neurological: Negative for dizziness and headaches.    All other systems reviewed and are negative.           Physical Exam        Visit Vitals       BP  123/90 (BP 1 Location: Left arm, BP Patient Position: At rest)     Pulse  (!) 102     Temp  98.8 ??F (37.1 ??C)     Resp  18     Ht  6\' 1"  (1.854 m)     Wt  79.4 kg (175 lb)     SpO2  96%        BMI  23.09 kg/m??              Physical Exam   Vitals signs and nursing note reviewed.   Constitutional:        General: He is not in acute distress.     Appearance: He is well-developed. He is not diaphoretic.  HENT:       Head: Normocephalic and atraumatic.      Mouth/Throat:      Comments: Diffusely poor dentition.  No localized induration or fluctuance to suggest drainable abscess. No sublingual/submandibular  induration, trismus, or stridor.  Normal speech.  Handling oral secretions without difficulty.  Pt with full ROM of neck.  Eyes:       Conjunctiva/sclera: Conjunctivae normal.   Neck :       Musculoskeletal: Normal range of motion and neck supple.   Cardiovascular :       Rate and Rhythm: Normal rate and regular rhythm.      Heart sounds: Normal heart sounds.    Pulmonary:       Effort: Pulmonary effort is normal. No respiratory distress.      Breath sounds: Normal breath sounds.   Chest:       Chest wall: No tenderness.   Musculoskeletal : Normal range of motion.          General: No deformity.    Skin:      General: Skin is warm and dry.   Neurological :       Mental Status: He is alert and oriented to person, place, and time.                 Diagnostic Study Results        Labs -   No results found for this or any previous visit (from the past 12 hour(s)).      Radiologic Studies -    No results found.           Medical Decision Making     I am the first provider for this patient.      I reviewed the vital signs, available nursing notes, past medical history, past surgical history, family history and social history.      Vital Signs-Reviewed the patient's vital signs.      Pulse Oximetry Analysis -  96% on room  air (Interpretation)      Records Reviewed: Nursing Notes and Old Medical Records  (Time of  Review: 1:43 PM)      ED Course: Progress Notes, Reevaluation, and Consults:      Provider Notes (Medical Decision Making):    Differential Diagnosis:  Dentalgia, dental caries, dental fracture, periodontal abscess/cellulitis, gingivitis, facial abscess/cellulitis      Plan:  Pt presents ambulatory in NAD, well-hydrated, non-toxic in appearance, with unremarkable vitals.  Exam reveals diffusely poor dentition without localized induration or fluctuance  to suggest drainable abscess. No sublingual/submandibular induration, trismus, or stridor.  Normal speech.  Handling oral secretions without difficulty.  Pt with full ROM of neck. Low suspicion for Ludwig's angina or deep space infection.  Will DC home  with norco, amoxicillin.  Pt is strongly advised to follow-up with dentist in short period of time as they will need definitive management.        At this time, patient is stable and appropriate for discharge home.  Patient demonstrates understanding of current diagnoses and is in agreement with the treatment plan.  They are advised that while the likelihood of serious underlying condition is low  at this point given the evaluation performed today, we cannot fully rule it out.  They are advised to immediately return with any new symptoms or worsening of current condition.  All questions have been answered.  Patient is given educational material  regarding their diagnoses, including danger symptoms and when to  return to the ED.            Diagnosis        Clinical Impression:       1.  Pain, dental            Disposition: DC Home        Follow-up Information               Follow up With  Specialties  Details  Why  Contact Info              KO[facility] SMILES    Call  For follow-up  [address]   [place] [id number]   [phone]              [facility]  Emergency Medicine  Go to  As needed, If symptoms worsen  [address]   [place] [phone]   [phone]                    Patient's Medications       Start Taking           AMOXICILLIN 500 MG TAB     Take 500 mg by mouth three (3) times daily for 10 days.           HYDROCODONE-ACETAMINOPHEN (NORCO) 5-325 MG PER TABLET     Take 1 Tab by mouth every eight (8) hours as needed for Pain for up to 2 days. Max Daily Amount: 3 Tabs.       Continue Taking           NAPROXEN SODIUM (ALEVE) 220 MG CAP     Take  by mouth.       These Medications have changed          No medications on file       Stop Taking           SODIUM CHLORIDE (NASAL MIST) 0.9 % SPRA     1 Spray by Nasal route as needed (congestion).        _______________________________      This note was dictated utilizing voice recognition software which may lead to typographical errors.  I apologize in advance if the situation occurs.  If questions arise please do not hesitate to contact  me or call our department.  Michiel SitesAlison H Madisun Hargrove, PA-C

## 2018-04-01 NOTE — ED Notes (Signed)
C/o dental pain and facial swelling that began today.  Requests work note.  Poor dentition noted.   C/o dental pain to left upper tooth.

## 2018-04-01 NOTE — ED Notes (Signed)
Discharge instructions reviewed with patient.  Patient voices understanding.  Patient advised to follow up as directed on discharge instructions.  Patient denies questions, needs or concerns at discharge regarding discharge instructions.  Patient voiced understanding.   No s/sx of distress noted.  Patient received written discharge instructions from [place], [place]

## 2018-04-01 NOTE — ED Triage Notes (Signed)
C/o dental pain and facial swelling that began today.  Requests work note.  Poor dentition noted.   C/o dental pain to left upper tooth.

## 2018-04-01 NOTE — ED Notes (Signed)
Discharge instructions reviewed with patient.  Patient voices understanding.  Patient advised to follow up as directed on discharge instructions.  Patient denies questions, needs or concerns at discharge regarding discharge instructions.  Patient voiced understanding.   No s/sx of distress noted.  Patient received written discharge instructions from Angela, RN

## 2018-04-01 NOTE — ED Provider Notes (Signed)
EMERGENCY DEPARTMENT HISTORY AND PHYSICAL EXAM    1:43 PM      Date: 04/01/2018  Patient Name: Tim Peterson    History of Presenting Illness     Chief Complaint   Patient presents with   ??? Dental Pain   ??? Facial Swelling       History Provided By: Patient    Chief Complaint: Left upper dental pain, needs work note  Duration:  Hours  Timing:  Acute  Location:   Quality: Aching  Severity: 8 out of 10  Modifying Factors: Partially relieved by Aleve  Associated Symptoms: Left-sided facial swelling      Additional History (Context):Tim Peterson is a 48 y.o. male with a pertinent history of kidney stones, hypertension who presents to the emergency department for evaluation of left upper dental pain and left sided facial swelling upon awakening this morning.  Patient states he went to work, but had to leave due to pain and swelling.  When he got home, he took 2 Aleve and went to sleep.  When he awoke, he states the pain is somewhat better and the swelling has gone down, but now he needs a work note to return.  Patient denies any fevers or chills, nausea, vomiting, diarrhea, difficulty breathing, difficulty swallowing, or any other complaints.      PCP:  None      Current Outpatient Medications   Medication Sig Dispense Refill   ??? naproxen sodium (ALEVE) 220 mg cap Take  by mouth.     ??? amoxicillin 500 mg tab Take 500 mg by mouth three (3) times daily for 10 days. 30 Tab 0   ??? HYDROcodone-acetaminophen (NORCO) 5-325 mg per tablet Take 1 Tab by mouth every eight (8) hours as needed for Pain for up to 2 days. Max Daily Amount: 3 Tabs. 6 Tab 0       Past History     Past Medical History:  Past Medical History:   Diagnosis Date   ??? Elevated blood pressure reading    ??? Kidney stones    ??? Pain in joint of right shoulder        Past Surgical History:  Past Surgical History:   Procedure Laterality Date   ??? HX ORTHOPAEDIC      left leg pins       Family History:  History reviewed. No pertinent family history.     Social History:  Social History     Tobacco Use   ??? Smoking status: Never Smoker   ??? Smokeless tobacco: Never Used   Substance Use Topics   ??? Alcohol use: No     Comment: occasional   ??? Drug use: Not Currently     Types: Marijuana       Allergies:  No Known Allergies      Review of Systems       Review of Systems   Constitutional: Negative for chills and fever.   HENT: Positive for dental problem. Negative for congestion, rhinorrhea and sore throat.    Respiratory: Negative for cough and shortness of breath.    Cardiovascular: Negative for chest pain.   Gastrointestinal: Negative for abdominal pain, blood in stool, constipation, diarrhea, nausea and vomiting.   Genitourinary: Negative for dysuria, frequency and hematuria.   Musculoskeletal: Negative for back pain and myalgias.   Skin: Negative for rash and wound.   Neurological: Negative for dizziness and headaches.   All other systems reviewed and are negative.  Physical Exam     Visit Vitals  BP 123/90 (BP 1 Location: Left arm, BP Patient Position: At rest)   Pulse (!) 102   Temp 98.8 ??F (37.1 ??C)   Resp 18   Ht 6\' 1"  (1.854 m)   Wt 79.4 kg (175 lb)   SpO2 96%   BMI 23.09 kg/m??         Physical Exam  Vitals signs and nursing note reviewed.   Constitutional:       General: He is not in acute distress.     Appearance: He is well-developed. He is not diaphoretic.   HENT:      Head: Normocephalic and atraumatic.      Mouth/Throat:      Comments: Diffusely poor dentition.  No localized induration or fluctuance to suggest drainable abscess. No sublingual/submandibular induration, trismus, or stridor.  Normal speech.  Handling oral secretions without difficulty.  Pt with full ROM of neck.  Eyes:      Conjunctiva/sclera: Conjunctivae normal.   Neck:      Musculoskeletal: Normal range of motion and neck supple.   Cardiovascular:      Rate and Rhythm: Normal rate and regular rhythm.      Heart sounds: Normal heart sounds.   Pulmonary:       Effort: Pulmonary effort is normal. No respiratory distress.      Breath sounds: Normal breath sounds.   Chest:      Chest wall: No tenderness.   Musculoskeletal: Normal range of motion.         General: No deformity.   Skin:     General: Skin is warm and dry.   Neurological:      Mental Status: He is alert and oriented to person, place, and time.           Diagnostic Study Results     Labs -  No results found for this or any previous visit (from the past 12 hour(s)).    Radiologic Studies -   No results found.      Medical Decision Making   I am the first provider for this patient.    I reviewed the vital signs, available nursing notes, past medical history, past surgical history, family history and social history.    Vital Signs-Reviewed the patient's vital signs.    Pulse Oximetry Analysis -  96% on room air (Interpretation)    Records Reviewed: Nursing Notes and Old Medical Records (Time of Review: 1:43 PM)    ED Course: Progress Notes, Reevaluation, and Consults:    Provider Notes (Medical Decision Making):   Differential Diagnosis:  Dentalgia, dental caries, dental fracture, periodontal abscess/cellulitis, gingivitis, facial abscess/cellulitis    Plan:  Pt presents ambulatory in NAD, well-hydrated, non-toxic in appearance, with unremarkable vitals.  Exam reveals diffusely poor dentition without localized induration or fluctuance to suggest drainable abscess. No sublingual/submandibular induration, trismus, or stridor.  Normal speech.  Handling oral secretions without difficulty.  Pt with full ROM of neck. Low suspicion for Ludwig's angina or deep space infection.  Will DC home with norco, amoxicillin.  Pt is strongly advised to follow-up with dentist in short period of time as they will need definitive management.      At this time, patient is stable and appropriate for discharge home.  Patient demonstrates understanding of current diagnoses and is in  agreement with the treatment plan.  They are advised that while the likelihood of serious underlying condition is low at this  point given the evaluation performed today, we cannot fully rule it out.  They are advised to immediately return with any new symptoms or worsening of current condition.  All questions have been answered.  Patient is given educational material regarding their diagnoses, including danger symptoms and when to return to the ED.       Diagnosis     Clinical Impression:   1. Pain, dental        Disposition: DC Home    Follow-up Information     Follow up With Specialties Details Why Contact Info    [facility]  Call For follow-up [address]  [place] [id number]  [phone]    [facility] Emergency Medicine Go to As needed, If symptoms worsen [address]  [place] [phone]  [phone]           Patient's Medications   Start Taking    AMOXICILLIN 500 MG TAB    Take 500 mg by mouth three (3) times daily for 10 days.    HYDROCODONE-ACETAMINOPHEN (NORCO) 5-325 MG PER TABLET    Take 1 Tab by mouth every eight (8) hours as needed for Pain for up to 2 days. Max Daily Amount: 3 Tabs.   Continue Taking    NAPROXEN SODIUM (ALEVE) 220 MG CAP    Take  by mouth.   These Medications have changed    No medications on file   Stop Taking    SODIUM CHLORIDE (NASAL MIST) 0.9 % SPRA    1 Spray by Nasal route as needed (congestion).     _______________________________    This note was dictated utilizing voice recognition software which may lead to typographical errors.  I apologize in advance if the situation occurs.  If questions arise please do not hesitate to contact me or call our department.  Michiel SitesAlison H Alasdair Kleve, PA-C

## 2018-05-04 ENCOUNTER — Emergency Department: Admit: 2018-05-04 | Payer: PRIVATE HEALTH INSURANCE | Primary: Nurse Practitioner

## 2018-05-04 ENCOUNTER — Inpatient Hospital Stay
Admit: 2018-05-04 | Discharge: 2018-05-04 | Disposition: A | Discharge status: Home / Self Care | Payer: PRIVATE HEALTH INSURANCE | Attending: Emergency Medicine

## 2018-05-04 DIAGNOSIS — R42 Dizziness and giddiness: Secondary | ICD-10-CM

## 2018-05-04 LAB — CBC WITH AUTO DIFFERENTIAL
Basophils %: 1 % (ref 0–2)
Basophils Absolute: 0 10*3/uL (ref 0.0–0.1)
Eosinophils %: 2 % (ref 0–5)
Eosinophils Absolute: 0.1 10*3/uL (ref 0.0–0.4)
Hematocrit: 42 % (ref 36.0–48.0)
Hemoglobin: 14 g/dL (ref 13.0–16.0)
Lymphocytes %: 22 % (ref 21–52)
Lymphocytes Absolute: 1.4 10*3/uL (ref 0.9–3.6)
MCH: 31.4 PG (ref 24.0–34.0)
MCHC: 33.3 g/dL (ref 31.0–37.0)
MCV: 94.2 FL (ref 74.0–97.0)
MPV: 9.3 FL (ref 9.2–11.8)
Monocytes %: 6 % (ref 3–10)
Monocytes Absolute: 0.4 10*3/uL (ref 0.05–1.2)
Neutrophils %: 69 % (ref 40–73)
Neutrophils Absolute: 4.4 10*3/uL (ref 1.8–8.0)
Platelets: 268 10*3/uL (ref 135–420)
RBC: 4.46 M/uL — ABNORMAL LOW (ref 4.70–5.50)
RDW: 12.8 % (ref 11.6–14.5)
WBC: 6.3 10*3/uL (ref 4.6–13.2)

## 2018-05-04 LAB — BASIC METABOLIC PANEL
Anion Gap: 3 mmol/L (ref 3.0–18)
BUN: 13 MG/DL (ref 7.0–18)
Bun/Cre Ratio: 14 (ref 12–20)
CO2: 30 mmol/L (ref 21–32)
Calcium: 9.1 MG/DL (ref 8.5–10.1)
Chloride: 109 mmol/L (ref 100–111)
Creatinine: 0.94 MG/DL (ref 0.6–1.3)
EGFR IF NonAfrican American: >60 mL/min/{1.73_m2} (ref >60)
GFR African American: >60 mL/min/{1.73_m2} (ref >60)
Glucose: 103 mg/dL — ABNORMAL HIGH (ref 74–99)
Potassium: 4.2 mmol/L (ref 3.5–5.5)
Sodium: 142 mmol/L (ref 136–145)

## 2018-05-04 LAB — EKG 12-LEAD
Atrial Rate: 63 {beats}/min
Diagnosis: NORMAL
P Axis: 75 degrees
P-R Interval: 202 ms
Q-T Interval: 402 ms
QRS Duration: 80 ms
QTc Calculation (Bazett): 411 ms
R Axis: 39 degrees
T Axis: 62 degrees
Ventricular Rate: 63 {beats}/min

## 2018-05-04 LAB — EKG, 12 LEAD, INITIAL
Atrial Rate: 63 {beats}/min
Calculated P Axis: 75 degrees
Calculated R Axis: 39 degrees
Calculated T Axis: 62 degrees
Diagnosis: NORMAL
P-R Interval: 202 ms
Q-T Interval: 402 ms
QRS Duration: 80 ms
QTC Calculation (Bezet): 411 ms
Ventricular Rate: 63 {beats}/min

## 2018-05-04 LAB — CBC WITH AUTOMATED DIFF
ABS. BASOPHILS: 0 10*3/uL (ref 0.0–0.1)
ABS. EOSINOPHILS: 0.1 10*3/uL (ref 0.0–0.4)
ABS. LYMPHOCYTES: 1.4 10*3/uL (ref 0.9–3.6)
ABS. MONOCYTES: 0.4 10*3/uL (ref 0.05–1.2)
ABS. NEUTROPHILS: 4.4 10*3/uL (ref 1.8–8.0)
BASOPHILS: 1 % (ref 0–2)
EOSINOPHILS: 2 % (ref 0–5)
HCT: 42 % (ref 36.0–48.0)
HGB: 14 g/dL (ref 13.0–16.0)
LYMPHOCYTES: 22 % (ref 21–52)
MCH: 31.4 PG (ref 24.0–34.0)
MCHC: 33.3 g/dL (ref 31.0–37.0)
MCV: 94.2 FL (ref 74.0–97.0)
MONOCYTES: 6 % (ref 3–10)
MPV: 9.3 FL (ref 9.2–11.8)
NEUTROPHILS: 69 % (ref 40–73)
PLATELET: 268 10*3/uL (ref 135–420)
RBC: 4.46 M/uL — ABNORMAL LOW (ref 4.70–5.50)
RDW: 12.8 % (ref 11.6–14.5)
WBC: 6.3 10*3/uL (ref 4.6–13.2)

## 2018-05-04 LAB — METABOLIC PANEL, BASIC
Anion gap: 3 mmol/L (ref 3.0–18)
BUN/Creatinine ratio: 14 (ref 12–20)
BUN: 13 MG/DL (ref 7.0–18)
CO2: 30 mmol/L (ref 21–32)
Calcium: 9.1 MG/DL (ref 8.5–10.1)
Chloride: 109 mmol/L (ref 100–111)
Creatinine: 0.94 MG/DL (ref 0.6–1.3)
GFR est AA: >60 mL/min/{1.73_m2} (ref >60)
GFR est non-AA: >60 mL/min/{1.73_m2} (ref >60)
Glucose: 103 mg/dL — ABNORMAL HIGH (ref 74–99)
Potassium: 4.2 mmol/L (ref 3.5–5.5)
Sodium: 142 mmol/L (ref 136–145)

## 2018-05-04 MED ORDER — SODIUM CHLORIDE 0.9% BOLUS IV
0.9 % | Freq: Once | INTRAVENOUS | Status: AC
Start: 2018-05-04 — End: 2018-05-04
  Administered 2018-05-04: 14:00:00 via INTRAVENOUS

## 2018-05-04 MED ORDER — MECLIZINE 12.5 MG TAB
12.5 mg | ORAL | Status: AC
Start: 2018-05-04 — End: 2018-05-04
  Administered 2018-05-04: 15:00:00 via ORAL

## 2018-05-04 MED FILL — MECLIZINE 12.5 MG TAB: 12.5 mg | ORAL | Qty: 2

## 2018-05-04 MED FILL — SODIUM CHLORIDE 0.9 % IV: INTRAVENOUS | Qty: 1000

## 2018-05-04 NOTE — ED Notes (Signed)
I have reviewed discharge instructions with the patient.  The patient verbalized understanding.  Patient armband removed and shredded

## 2018-05-04 NOTE — ED Notes (Signed)
Pt reports intermittent dizziness x 3 months.  States notices dizziness upon awakening.  States present since Friday.  States has history of HTN but has been off medication since 2011 due to not following up with his PCP.  Also reports headache. States under treatment for dental infection and has 2 doses of antibiotic left.Pt denies any other concerns and presents in NAD with steady gait.

## 2018-05-04 NOTE — ED Provider Notes (Signed)
EMERGENCY DEPARTMENT HISTORY AND PHYSICAL EXAM    8:15 AM      Date: 05/04/2018  Patient Name: Tim Peterson    History of Presenting Illness     Chief Complaint   Patient presents with   ??? Dizziness         History Provided By: Patient    Additional History (Context): Tim Peterson is a 48 y.o. male with Past medical history of kidney stones, elevated blood pressure who presents with chief complaint of intermittent lightheadedness for the past 3 months.  He states it is worse with certain movements.  He states that the room does not spin at all.  Patient states that he was on blood pressure medicine but he has not been taking in a while, does not recall the name of it, and states he was not compliant with his medication and did not follow-up with his primary care doctor as he should have..  Associated symptom is mild headache which is resolving now.  He is being treated for a dental infection which is much better and he is almost done with his antibiotic.  No fever, sinus pressure, trauma, neck pain, numbness, ear pain or tinnitus, visual change, weakness, chest pain, cough, palpitations, abdominal pain, nausea, and no other complaint.    PCP: None        Past History     Past Medical History:  Past Medical History:   Diagnosis Date   ??? Elevated blood pressure reading    ??? Kidney stones    ??? Pain in joint of right shoulder    ??? TB (tuberculosis), treated        Past Surgical History:  Past Surgical History:   Procedure Laterality Date   ??? HX ORTHOPAEDIC      left leg pins       Family History:  No family history on file.    Social History:  Social History     Tobacco Use   ??? Smoking status: Never Smoker   ??? Smokeless tobacco: Never Used   Substance Use Topics   ??? Alcohol use: No     Comment: occasional   ??? Drug use: Not Currently     Types: Marijuana       Allergies:  No Known Allergies      Review of Systems       Review of Systems   Constitutional: Negative for chills and fever.   HENT: Negative for  congestion, rhinorrhea, sore throat and trouble swallowing.    Eyes: Negative for pain and visual disturbance.   Respiratory: Negative for cough, shortness of breath and wheezing.    Cardiovascular: Negative for chest pain, palpitations and leg swelling.   Gastrointestinal: Negative for abdominal pain, nausea and vomiting.   Endocrine: Negative for polyuria.   Genitourinary: Negative for difficulty urinating, dysuria and flank pain.   Musculoskeletal: Negative for arthralgias and neck stiffness.   Skin: Negative for rash.   Neurological: Positive for dizziness, light-headedness and headaches. Negative for syncope, speech difficulty, weakness and numbness.   Hematological: Does not bruise/bleed easily.   Psychiatric/Behavioral: Negative for confusion and dysphoric mood.   All other systems reviewed and are negative.        Physical Exam     Visit Vitals  BP (!) 149/107   Pulse (!) 49   Temp 98.6 ??F (37 ??C)   Resp 17   Ht 6\' 1"  (1.854 m)   Wt 78.9 kg (174 lb)   SpO2  100%   BMI 22.96 kg/m??         Physical Exam  Vitals signs and nursing note reviewed.   Constitutional:       General: He is not in acute distress.     Appearance: He is well-developed. He is not ill-appearing, toxic-appearing or diaphoretic.   HENT:      Head: Normocephalic and atraumatic.      Nose: Nose normal.      Mouth/Throat:      Mouth: Mucous membranes are moist.      Pharynx: Oropharynx is clear.   Eyes:      General: No scleral icterus.     Conjunctiva/sclera: Conjunctivae normal.      Pupils: Pupils are equal, round, and reactive to light.   Neck:      Musculoskeletal: Normal range of motion and neck supple.   Cardiovascular:      Rate and Rhythm: Normal rate.      Pulses: Normal pulses.      Heart sounds: Normal heart sounds. No murmur. No friction rub. No gallop.       Comments: Capillary refill < 3 seconds  Pulmonary:      Effort: Pulmonary effort is normal. No respiratory distress.      Breath sounds: Normal breath sounds. No wheezing.    Abdominal:      General: Bowel sounds are normal. There is no distension.      Palpations: Abdomen is soft.      Tenderness: There is no abdominal tenderness.   Musculoskeletal: Normal range of motion.      Right lower leg: No edema.      Left lower leg: No edema.   Lymphadenopathy:      Cervical: No cervical adenopathy.   Skin:     General: Skin is warm and dry.      Coloration: Skin is not jaundiced or pale.   Neurological:      General: No focal deficit present.      Mental Status: He is alert and oriented to person, place, and time.      Cranial Nerves: No cranial nerve deficit.      Sensory: No sensory deficit.      Motor: No weakness.      Coordination: Coordination normal.      Gait: Gait normal.      Deep Tendon Reflexes: Reflexes normal.   Psychiatric:         Mood and Affect: Mood normal.         Thought Content: Thought content normal.           Diagnostic Study Results       Recent Results (from the past 24 hour(s))   EKG, 12 LEAD, INITIAL    Collection Time: 05/04/18  8:30 AM   Result Value Ref Range    Ventricular Rate 63 BPM    Atrial Rate 63 BPM    P-R Interval 202 ms    QRS Duration 80 ms    Q-T Interval 402 ms    QTC Calculation (Bezet) 411 ms    Calculated P Axis 75 degrees    Calculated R Axis 39 degrees    Calculated T Axis 62 degrees    Diagnosis       Normal sinus rhythm  Minimal voltage criteria for LVH, may be normal variant  Borderline ECG  No previous ECGs available  Confirmed by Zoila Shutter, MD, Vernia Buff [phone]) on 05/04/2018 12:12:15 PM  CBC WITH AUTOMATED DIFF    Collection Time: 05/04/18  8:59 AM   Result Value Ref Range    WBC 6.3 4.6 - 13.2 K/uL    RBC 4.46 (L) 4.70 - 5.50 M/uL    HGB 14.0 13.0 - 16.0 g/dL    HCT 99.8 33.8 - 25.0 %    MCV 94.2 74.0 - 97.0 FL    MCH 31.4 24.0 - 34.0 PG    MCHC 33.3 31.0 - 37.0 g/dL    RDW 53.9 76.7 - 34.1 %    PLATELET 268 135 - 420 K/uL    MPV 9.3 9.2 - 11.8 FL    NEUTROPHILS 69 40 - 73 %    LYMPHOCYTES 22 21 - 52 %    MONOCYTES 6 3 - 10 %     EOSINOPHILS 2 0 - 5 %    BASOPHILS 1 0 - 2 %    ABS. NEUTROPHILS 4.4 1.8 - 8.0 K/UL    ABS. LYMPHOCYTES 1.4 0.9 - 3.6 K/UL    ABS. MONOCYTES 0.4 0.05 - 1.2 K/UL    ABS. EOSINOPHILS 0.1 0.0 - 0.4 K/UL    ABS. BASOPHILS 0.0 0.0 - 0.1 K/UL    DF AUTOMATED     METABOLIC PANEL, BASIC    Collection Time: 05/04/18  8:59 AM   Result Value Ref Range    Sodium 142 136 - 145 mmol/L    Potassium 4.2 3.5 - 5.5 mmol/L    Chloride 109 100 - 111 mmol/L    CO2 30 21 - 32 mmol/L    Anion gap 3 3.0 - 18 mmol/L    Glucose 103 (H) 74 - 99 mg/dL    BUN 13 7.0 - 18 MG/DL    Creatinine 9.37 0.6 - 1.3 MG/DL    BUN/Creatinine ratio 14 12 - 20      GFR est AA >60 >60 ml/min/1.51m2    GFR est non-AA >60 >60 ml/min/1.5m2    Calcium 9.1 8.5 - 10.1 MG/DL     Labs -  No results found for this or any previous visit (from the past 12 hour(s)).    Radiologic Studies -   CT HEAD WO CONT   Final Result   IMPRESSION: No acute intracranial findings by CT.      XR CHEST PORT   Final Result   IMPRESSION: No acute pulmonary disease            Medical Decision Making   I am the first provider for this patient.    I reviewed the vital signs, available nursing notes, past medical history, past surgical history, family history and social history.    Vital Signs-Reviewed the patient's vital signs.        Cardiac Monitor:  Rate: 64  Rhythm:  Normal Sinus Rhythm       Records Reviewed: Nursing Notes and Old Medical Records (Time of Review: 8:15 AM)    Provider Notes (Medical Decision Making): DDX: Orthostatic, vertigo, brain mass, metabolic, cardiac, dehydration    Labs, EKG, CT brain, IV fluid, meclizine      MDM    Medications   sodium chloride 0.9 % bolus infusion 1,000 mL (0 mL IntraVENous IV Completed 05/04/18 1007)   meclizine (ANTIVERT) tablet 25 mg (25 mg Oral Given 05/04/18 1006)           ED Course: Progress Notes, Reevaluation, and Consults:  No alarming labs    Consult life coach    Gave referral  to PCP and cardiology    Discussed risk of elevated blood  pressure.  He has no blurred vision, chest pain otherwise appears asymptomatic for this.  We will have him follow-up with a referral PCP and get repeat blood pressure check    I have reassessed the patient. I have discussed the workup, results and plan with the patient and patient is in agreement.  Patient is feeling better. Patient was discharge in stable condition. Patient was given outpatient follow up.  Patient is to return to emergency department if any new or worsening condition.     Diagnosis     Clinical Impression:   1. Dizziness    2. Nonintractable episodic headache, unspecified headache type    3. Bradycardia        Disposition: Discharged    Follow-up Information     Follow up With Specialties Details Why Contact Info    [facility] [place]  Call today Discuss need for follow-up primary care provider, and cardiologist [facility]  [place] [id number]  [phone]    Trina AoSkillen, Edward, DO Cardiology Schedule an appointment as soon as possible for a visit in 2 days  [address]  [address]  [place] [id number]  [phone]      Thomes DinningAnglin, Victor T, MD Internal Medicine Schedule an appointment as soon as possible for a visit in 3 days  [address]  [address]  [place] [id number]  [phone]      [facility] Emergency Medicine  As needed, If symptoms worsen [address]  [place] [id number]  [phone]           There are no discharge medications for this patient.        Angelena SoleAdrian Charlette Hennings, DO    Dragon medical dictation software was used for portions of this report.   Unintended transcription errors may occur.     My signature above authenticates this document and my orders, the final    diagnosis (es), discharge prescription (s), and instructions in the Epic    record.

## 2018-05-04 NOTE — ED Provider Notes (Signed)
EMERGENCY DEPARTMENT HISTORY AND PHYSICAL EXAM    8:15 AM      Date: 05/04/2018  Patient Name: Tim Peterson    History of Presenting Illness     Chief Complaint   Patient presents with   ??? Dizziness         History Provided By: Patient    Additional History (Context): Tim Peterson is a 48 y.o. male with Past medical history of kidney stones, elevated blood pressure who presents with chief complaint of intermittent lightheadedness for the past 3 months.  He states it is worse with certain movements.  He states that the room does not spin at all.  Patient states that he was on blood pressure medicine but he has not been taking in a while, does not recall the name of it, and states he was not compliant with his medication and did not follow-up with his primary care doctor as he should have..  Associated symptom is mild headache which is resolving now.  He is being treated for a dental infection which is much better and he is almost done with his antibiotic.  No fever, sinus pressure, trauma, neck pain, numbness, ear pain or tinnitus, visual change, weakness, chest pain, cough, palpitations, abdominal pain, nausea, and no other complaint.    PCP: None        Past History     Past Medical History:  Past Medical History:   Diagnosis Date   ??? Elevated blood pressure reading    ??? Kidney stones    ??? Pain in joint of right shoulder    ??? TB (tuberculosis), treated        Past Surgical History:  Past Surgical History:   Procedure Laterality Date   ??? HX ORTHOPAEDIC      left leg pins       Family History:  No family history on file.    Social History:  Social History     Tobacco Use   ??? Smoking status: Never Smoker   ??? Smokeless tobacco: Never Used   Substance Use Topics   ??? Alcohol use: No     Comment: occasional   ??? Drug use: Not Currently     Types: Marijuana       Allergies:  No Known Allergies      Review of Systems       Review of Systems   Constitutional: Negative for chills and fever.    HENT: Negative for congestion, rhinorrhea, sore throat and trouble swallowing.    Eyes: Negative for pain and visual disturbance.   Respiratory: Negative for cough, shortness of breath and wheezing.    Cardiovascular: Negative for chest pain, palpitations and leg swelling.   Gastrointestinal: Negative for abdominal pain, nausea and vomiting.   Endocrine: Negative for polyuria.   Genitourinary: Negative for difficulty urinating, dysuria and flank pain.   Musculoskeletal: Negative for arthralgias and neck stiffness.   Skin: Negative for rash.   Neurological: Positive for dizziness, light-headedness and headaches. Negative for syncope, speech difficulty, weakness and numbness.   Hematological: Does not bruise/bleed easily.   Psychiatric/Behavioral: Negative for confusion and dysphoric mood.   All other systems reviewed and are negative.        Physical Exam     Visit Vitals  BP (!) 149/107   Pulse (!) 49   Temp 98.6 ??F (37 ??C)   Resp 17   Ht 6\' 1"  (1.854 m)   Wt 78.9 kg (174 lb)   SpO2  100%   BMI 22.96 kg/m??         Physical Exam  Vitals signs and nursing note reviewed.   Constitutional:       General: He is not in acute distress.     Appearance: He is well-developed. He is not ill-appearing, toxic-appearing or diaphoretic.   HENT:      Head: Normocephalic and atraumatic.      Nose: Nose normal.      Mouth/Throat:      Mouth: Mucous membranes are moist.      Pharynx: Oropharynx is clear.   Eyes:      General: No scleral icterus.     Conjunctiva/sclera: Conjunctivae normal.      Pupils: Pupils are equal, round, and reactive to light.   Neck:      Musculoskeletal: Normal range of motion and neck supple.   Cardiovascular:      Rate and Rhythm: Normal rate.      Pulses: Normal pulses.      Heart sounds: Normal heart sounds. No murmur. No friction rub. No gallop.       Comments: Capillary refill < 3 seconds  Pulmonary:      Effort: Pulmonary effort is normal. No respiratory distress.       Breath sounds: Normal breath sounds. No wheezing.   Abdominal:      General: Bowel sounds are normal. There is no distension.      Palpations: Abdomen is soft.      Tenderness: There is no abdominal tenderness.   Musculoskeletal: Normal range of motion.      Right lower leg: No edema.      Left lower leg: No edema.   Lymphadenopathy:      Cervical: No cervical adenopathy.   Skin:     General: Skin is warm and dry.      Coloration: Skin is not jaundiced or pale.   Neurological:      General: No focal deficit present.      Mental Status: He is alert and oriented to person, place, and time.      Cranial Nerves: No cranial nerve deficit.      Sensory: No sensory deficit.      Motor: No weakness.      Coordination: Coordination normal.      Gait: Gait normal.      Deep Tendon Reflexes: Reflexes normal.   Psychiatric:         Mood and Affect: Mood normal.         Thought Content: Thought content normal.           Diagnostic Study Results       Recent Results (from the past 24 hour(s))   EKG, 12 LEAD, INITIAL    Collection Time: 05/04/18  8:30 AM   Result Value Ref Range    Ventricular Rate 63 BPM    Atrial Rate 63 BPM    P-R Interval 202 ms    QRS Duration 80 ms    Q-T Interval 402 ms    QTC Calculation (Bezet) 411 ms    Calculated P Axis 75 degrees    Calculated R Axis 39 degrees    Calculated T Axis 62 degrees    Diagnosis       Normal sinus rhythm  Minimal voltage criteria for LVH, may be normal variant  Borderline ECG  No previous ECGs available  Confirmed by Zoila Shutter, MD, Vernia Buff [phone]) on 05/04/2018 12:12:15 PM  CBC WITH AUTOMATED DIFF    Collection Time: 05/04/18  8:59 AM   Result Value Ref Range    WBC 6.3 4.6 - 13.2 K/uL    RBC 4.46 (L) 4.70 - 5.50 M/uL    HGB 14.0 13.0 - 16.0 g/dL    HCT 12.1 97.5 - 88.3 %    MCV 94.2 74.0 - 97.0 FL    MCH 31.4 24.0 - 34.0 PG    MCHC 33.3 31.0 - 37.0 g/dL    RDW 25.4 98.2 - 64.1 %    PLATELET 268 135 - 420 K/uL    MPV 9.3 9.2 - 11.8 FL    NEUTROPHILS 69 40 - 73 %     LYMPHOCYTES 22 21 - 52 %    MONOCYTES 6 3 - 10 %    EOSINOPHILS 2 0 - 5 %    BASOPHILS 1 0 - 2 %    ABS. NEUTROPHILS 4.4 1.8 - 8.0 K/UL    ABS. LYMPHOCYTES 1.4 0.9 - 3.6 K/UL    ABS. MONOCYTES 0.4 0.05 - 1.2 K/UL    ABS. EOSINOPHILS 0.1 0.0 - 0.4 K/UL    ABS. BASOPHILS 0.0 0.0 - 0.1 K/UL    DF AUTOMATED     METABOLIC PANEL, BASIC    Collection Time: 05/04/18  8:59 AM   Result Value Ref Range    Sodium 142 136 - 145 mmol/L    Potassium 4.2 3.5 - 5.5 mmol/L    Chloride 109 100 - 111 mmol/L    CO2 30 21 - 32 mmol/L    Anion gap 3 3.0 - 18 mmol/L    Glucose 103 (H) 74 - 99 mg/dL    BUN 13 7.0 - 18 MG/DL    Creatinine 5.83 0.6 - 1.3 MG/DL    BUN/Creatinine ratio 14 12 - 20      GFR est AA >60 >60 ml/min/1.81m2    GFR est non-AA >60 >60 ml/min/1.85m2    Calcium 9.1 8.5 - 10.1 MG/DL     Labs -  No results found for this or any previous visit (from the past 12 hour(s)).    Radiologic Studies -   CT HEAD WO CONT   Final Result   IMPRESSION: No acute intracranial findings by CT.      XR CHEST PORT   Final Result   IMPRESSION: No acute pulmonary disease            Medical Decision Making   I am the first provider for this patient.    I reviewed the vital signs, available nursing notes, past medical history, past surgical history, family history and social history.    Vital Signs-Reviewed the patient's vital signs.        Cardiac Monitor:  Rate: 64  Rhythm:  Normal Sinus Rhythm       Records Reviewed: Nursing Notes and Old Medical Records (Time of Review: 8:15 AM)    Provider Notes (Medical Decision Making): DDX: Orthostatic, vertigo, brain mass, metabolic, cardiac, dehydration    Labs, EKG, CT brain, IV fluid, meclizine      MDM    Medications   sodium chloride 0.9 % bolus infusion 1,000 mL (0 mL IntraVENous IV Completed 05/04/18 1007)   meclizine (ANTIVERT) tablet 25 mg (25 mg Oral Given 05/04/18 1006)           ED Course: Progress Notes, Reevaluation, and Consults:  No alarming labs    Consult life coach     Gave  referral to PCP and cardiology    Discussed risk of elevated blood pressure.  He has no blurred vision, chest pain otherwise appears asymptomatic for this.  We will have him follow-up with a referral PCP and get repeat blood pressure check    I have reassessed the patient. I have discussed the workup, results and plan with the patient and patient is in agreement.  Patient is feeling better. Patient was discharge in stable condition. Patient was given outpatient follow up.  Patient is to return to emergency department if any new or worsening condition.     Diagnosis     Clinical Impression:   1. Dizziness    2. Nonintractable episodic headache, unspecified headache type    3. Bradycardia        Disposition: Discharged    Follow-up Information     Follow up With Specialties Details Why Contact Info    [facility] [place]  Call today Discuss need for follow-up primary care provider, and cardiologist [facility] [address]  [phone]    Trina Ao, DO Cardiology Schedule an appointment as soon as possible for a visit in 2 days  [address]  [place] [address]  [phone]      Thomes Dinning, MD Internal Medicine Schedule an appointment as soon as possible for a visit in 3 days  [address]  [address]  [place] [address]  [phone]      [facility] Emergency Medicine  As needed, If symptoms worsen [address] [place] [address]  [phone]           There are no discharge medications for this patient.        Angelena Sole, DO    Dragon medical dictation software was used for portions of this report.   Unintended transcription errors may occur.     My signature above authenticates this document and my orders, the final    diagnosis (es), discharge prescription (s), and instructions in the Epic    record.

## 2018-05-04 NOTE — ED Notes (Signed)
I have reviewed discharge instructions with the patient.  The patient verbalized understanding.  Patient armband removed and shredded

## 2018-05-04 NOTE — ED Triage Notes (Addendum)
Pt reports intermittent dizziness x 3 months.  States notices dizziness upon awakening.  States present since Friday.  States has history of HTN but has been off medication since 2011 due to not following up with his PCP.  Also reports headache. States under treatment for dental infection and has 2 doses of antibiotic left.Pt denies any other concerns and presents in NAD with steady gait.

## 2018-08-24 IMAGING — DX DG CHEST 2V
2 series · 2 of 2 positions shown · non-contrast
Comparison: None.

CLINICAL DATA: Chills, body aches, congestion

EXAM:
CHEST  2 VIEW

[chest pa]
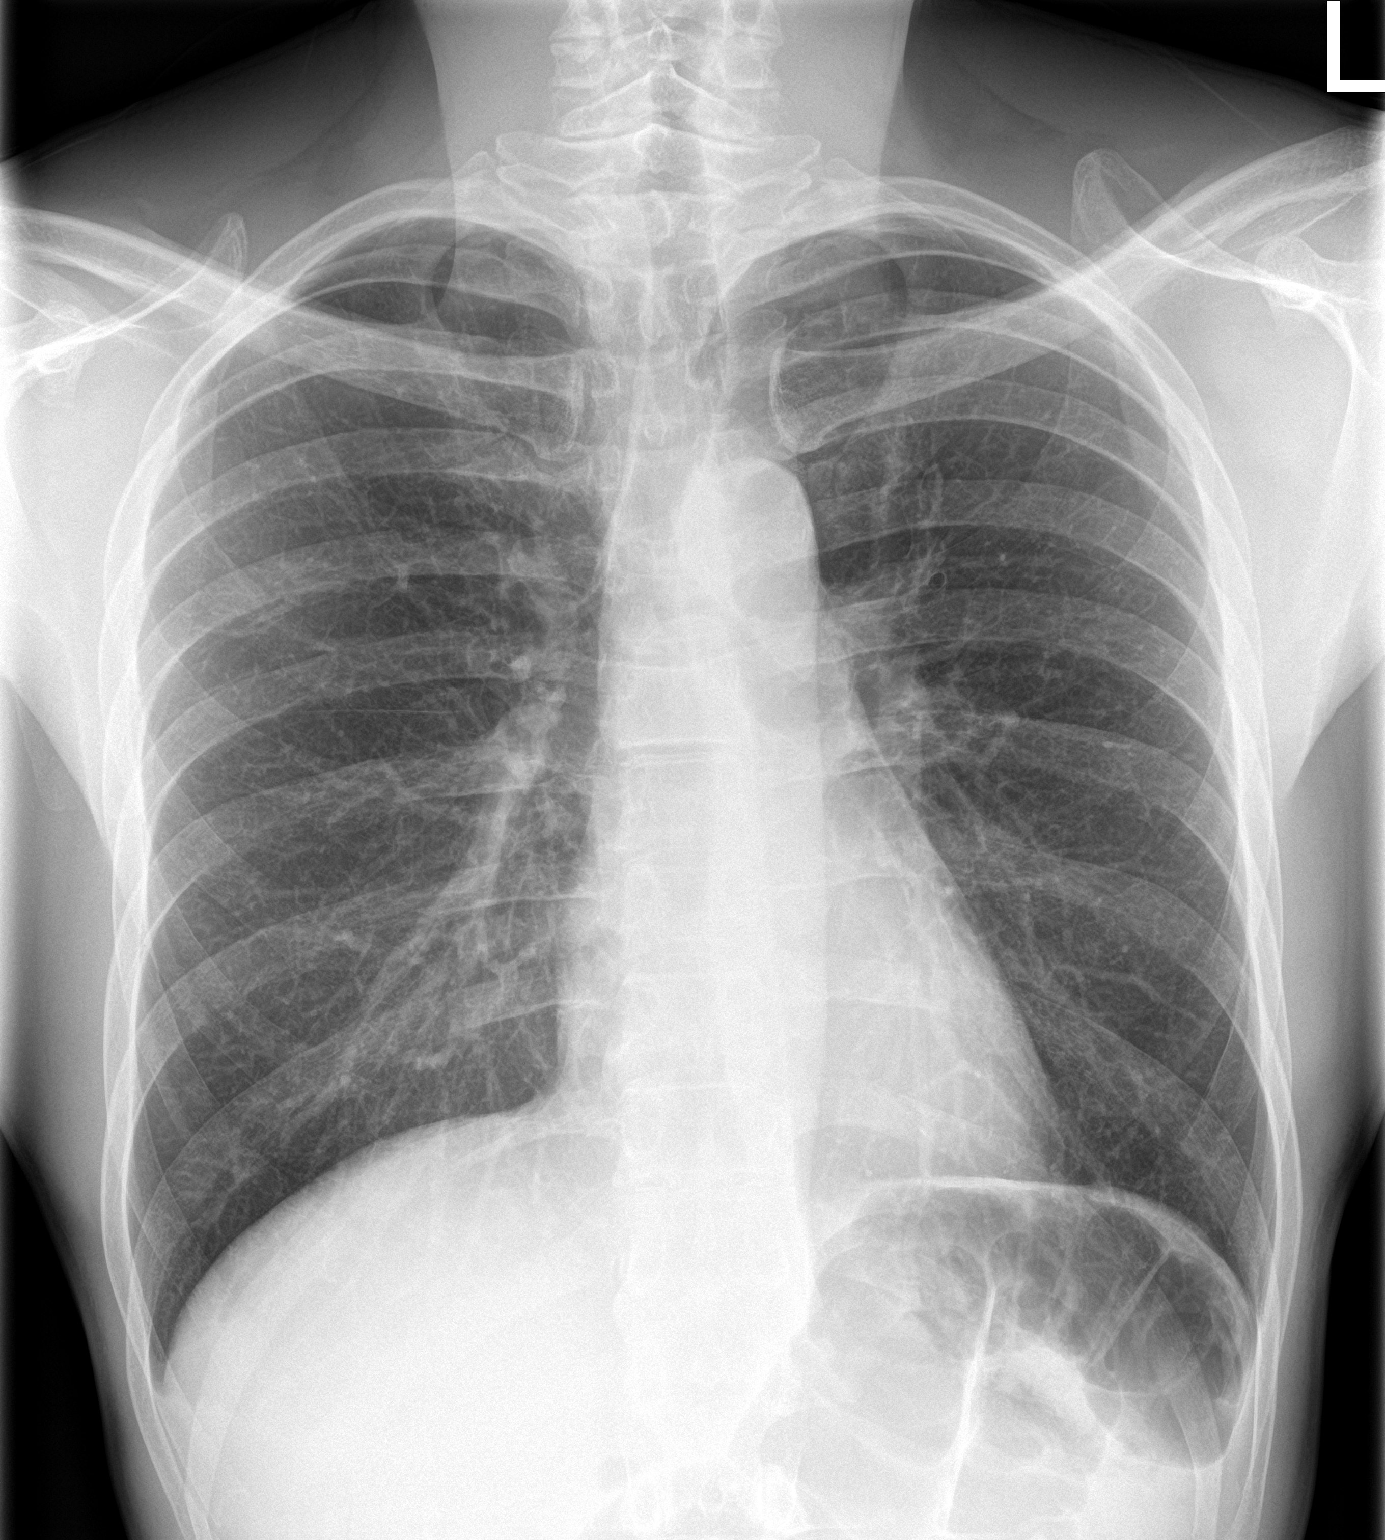

[chest lat]
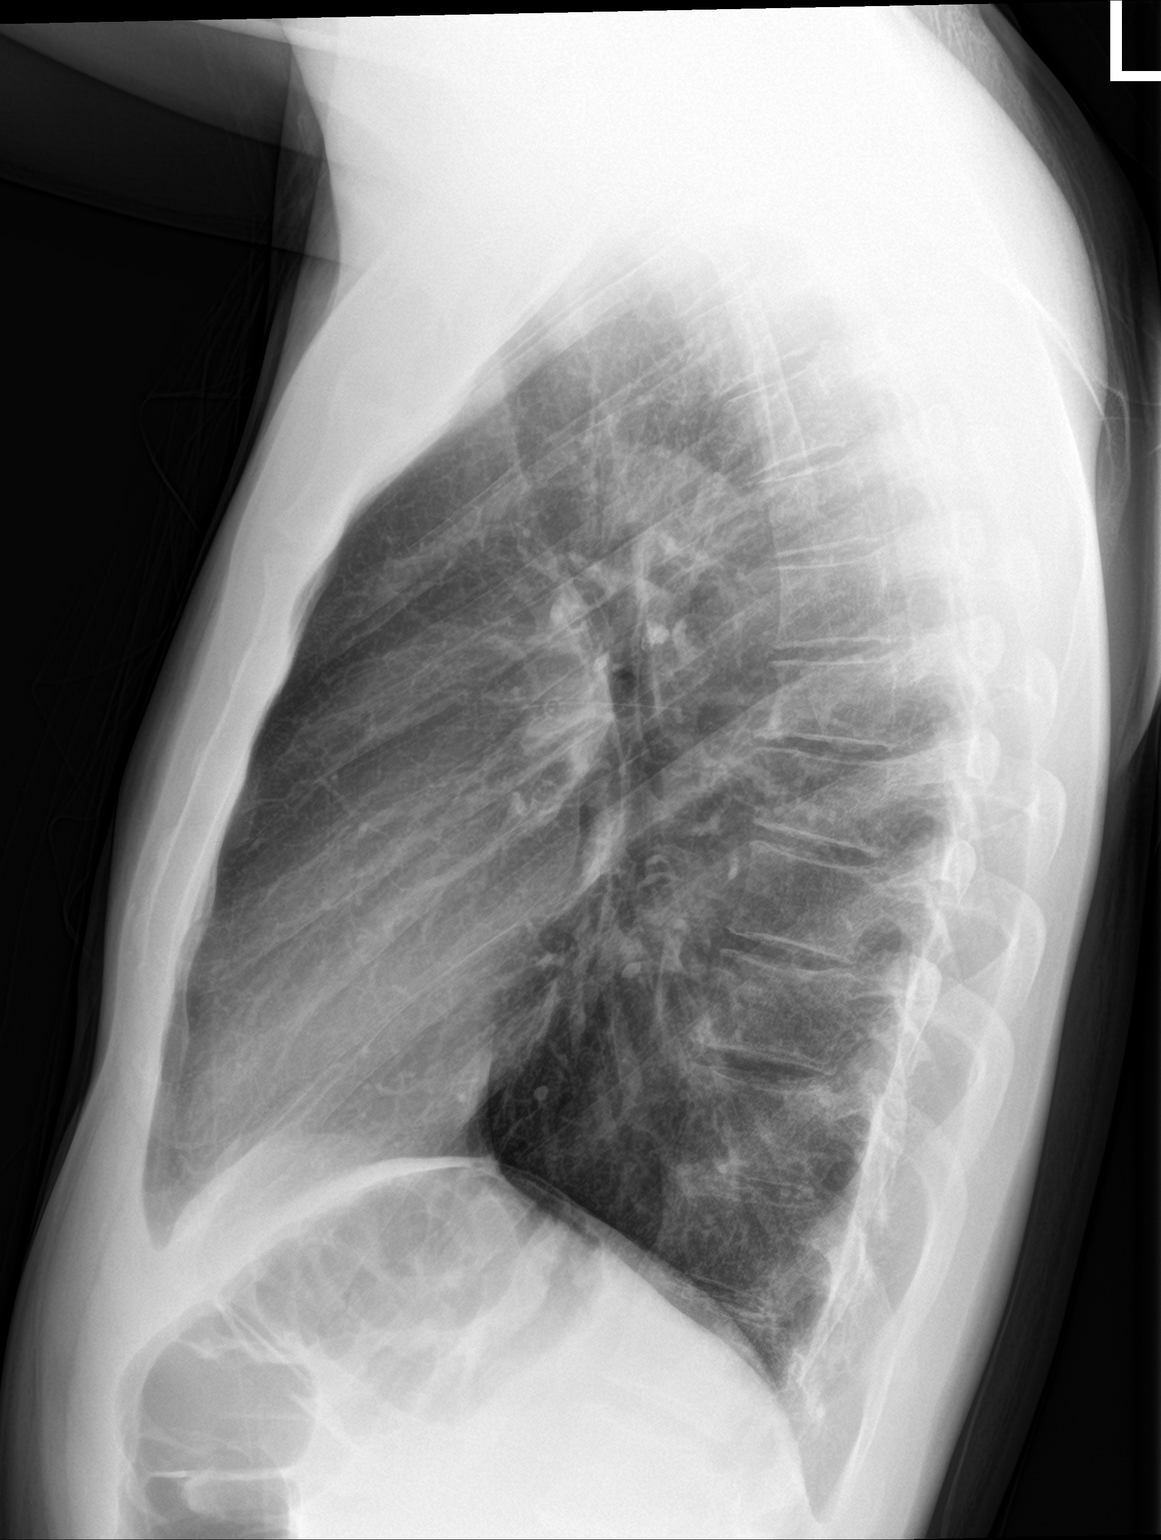

[2 of 2 positions shown; findings below may reference images not displayed]

FINDINGS: Cardiomediastinal silhouette is unremarkable. No infiltrate or
pleural effusion. No pulmonary edema. Mild degenerative changes mid
thoracic spine.
IMPRESSION: No active cardiopulmonary disease.

## 2018-09-06 ENCOUNTER — Emergency Department: Admit: 2018-09-06 | Payer: PRIVATE HEALTH INSURANCE | Primary: Nurse Practitioner

## 2018-09-06 ENCOUNTER — Inpatient Hospital Stay
Admit: 2018-09-06 | Discharge: 2018-09-06 | Disposition: A | Discharge status: Home / Self Care | Payer: PRIVATE HEALTH INSURANCE | Attending: Emergency Medicine

## 2018-09-06 DIAGNOSIS — M25512 Pain in left shoulder: Secondary | ICD-10-CM

## 2018-09-06 MED ORDER — NAPROXEN 375 MG TAB
375 mg | ORAL_TABLET | Freq: Two times a day (BID) | ORAL | 0 refills | Status: DC
Start: 2018-09-06 — End: 2020-02-24

## 2018-09-06 NOTE — ED Provider Notes (Signed)
48 year old male presents for evaluation of ongoing left shoulder pain.  Onset of pain almost a year ago.  Worse with movement.  Pain is a dull ache.  Patient reports he fell about 5 days ago and has had increasing pain in the shoulder since.  Made it difficult for him to sleep last night.  No fever.  No associated neck pain.  No paresthesia or weakness of the distal left upper extremity.           Past Medical History:   Diagnosis Date   ??? Elevated blood pressure reading    ??? Hypertension    ??? Kidney stones    ??? Pain in joint of right shoulder    ??? TB (tuberculosis), treated        Past Surgical History:   Procedure Laterality Date   ??? HX ORTHOPAEDIC      left leg pins         History reviewed. No pertinent family history.    Social History     Socioeconomic History   ??? Marital status: SINGLE     Spouse name: Not on file   ??? Number of children: Not on file   ??? Years of education: Not on file   ??? Highest education level: Not on file   Occupational History   ??? Not on file   Social Needs   ??? Financial resource strain: Not on file   ??? Food insecurity     Worry: Not on file     Inability: Not on file   ??? Transportation needs     Medical: Not on file     Non-medical: Not on file   Tobacco Use   ??? Smoking status: Never Smoker   ??? Smokeless tobacco: Never Used   Substance and Sexual Activity   ??? Alcohol use: Yes     Comment: occasional   ??? Drug use: Yes     Types: Marijuana     Comment: socially   ??? Sexual activity: Not on file   Lifestyle   ??? Physical activity     Days per week: Not on file     Minutes per session: Not on file   ??? Stress: Not on file   Relationships   ??? Social Product manager on phone: Not on file     Gets together: Not on file     Attends religious service: Not on file     Active member of club or organization: Not on file     Attends meetings of clubs or organizations: Not on file     Relationship status: Not on file   ??? Intimate partner violence     Fear of current or ex partner: Not on file      Emotionally abused: Not on file     Physically abused: Not on file     Forced sexual activity: Not on file   Other Topics Concern   ??? Not on file   Social History Narrative    ** Merged History Encounter **              ALLERGIES: Patient has no known allergies.    Review of Systems   Respiratory: Negative for shortness of breath.    Cardiovascular: Negative for chest pain.   Musculoskeletal: Positive for arthralgias (shoulder).   Skin: Negative for wound.   All other systems reviewed and are negative.      Vitals:  09/06/18 1009   BP: (!) 141/95   Pulse: 69   Resp: 20   SpO2: 100%   Weight: 78.9 kg (174 lb)   Height: 6\' 1"  (1.854 m)            Physical Exam  Vitals signs and nursing note reviewed.   Constitutional:       General: He is not in acute distress.     Appearance: He is well-developed.   HENT:      Head: Normocephalic and atraumatic.   Eyes:      General: No scleral icterus.  Cardiovascular:      Rate and Rhythm: Normal rate.   Pulmonary:      Effort: Pulmonary effort is normal.   Musculoskeletal:         General: No deformity or signs of injury.      Comments: Pain around the left shoulder with external rotation and abduction.  No visible deformity.  No bruising.  No soft tissue swelling.  Distal neurovascular preserved.  No trapezius or paracervical tenderness.   Skin:     General: Skin is warm and dry.   Neurological:      Mental Status: He is alert and oriented to person, place, and time.     X-ray left shoulder interpreted per myself as being within normal limits without acute pathologic findings.    MDM  Number of Diagnoses or Management Options  Arthralgia of left shoulder region:   Elevated blood pressure reading:   Diagnosis management comments: Impression: Chronic shoulder arthropathy.  Consider bursitis, rotator cuff tear, other tendinitis, ligamentous strain injury.  Screening x-ray negative.  Symptomatic treatment with orthopedic follow-up.         Procedures    Diagnosis:   1. Arthralgia  of left shoulder region    2. Elevated blood pressure reading          Disposition: home    Follow-up Information     Follow up With Specialties Details Why Contact Info    Birdena Jubilee, DO Hand Surgery Schedule an appointment as soon as possible for a visit for recheck of ongoing symptoms [place] [address]  [phone]      [facility]  Schedule an appointment as soon as possible for a visit for recheck of ongoing symptoms [place]  [phone]          Patient's Medications    No medications on file

## 2018-09-06 NOTE — ED Notes (Signed)
 Pt states he slipped and fell while getting out of tub last week. C/o left shoulder pain that has been on-going since 2019. Also c/o migraine. States has virtual visit last week and was told to ice shoulder and take tylenol  and ibuprofen  for pain.  Was given Naproxen which didn't help. Pt does not have a PCP.

## 2018-09-06 NOTE — ED Triage Notes (Signed)
Pt states he slipped and fell while getting out of tub last week. C/o left shoulder pain that has been on-going since 2019. Also c/o "migraine". States has virtual visit last week and was told to ice shoulder and take tylenol and ibuprofen for pain.  Was given Naproxen which didn't help. Pt does not have a PCP.

## 2018-09-06 NOTE — ED Notes (Signed)
Tim Peterson is a 47 y.o. male that was discharged in stable condition.  The patients diagnosis, condition and treatment were explained to  patient and aftercare instructions were given.  The patient verbalized understanding. Patient armband removed and shredded.

## 2018-09-06 NOTE — ED Provider Notes (Addendum)
48 year old male presents for evaluation of ongoing left shoulder pain.  Onset of pain almost a year ago.  Worse with movement.  Pain is a dull ache.  Patient reports he fell about 5 days ago and has had increasing pain in the shoulder since.  Made it difficult for him to sleep last night.  No fever.  No associated neck pain.  No paresthesia or weakness of the distal left upper extremity.           Past Medical History:   Diagnosis Date   ??? Elevated blood pressure reading    ??? Hypertension    ??? Kidney stones    ??? Pain in joint of right shoulder    ??? TB (tuberculosis), treated        Past Surgical History:   Procedure Laterality Date   ??? HX ORTHOPAEDIC      left leg pins         History reviewed. No pertinent family history.    Social History     Socioeconomic History   ??? Marital status: SINGLE     Spouse name: Not on file   ??? Number of children: Not on file   ??? Years of education: Not on file   ??? Highest education level: Not on file   Occupational History   ??? Not on file   Social Needs   ??? Financial resource strain: Not on file   ??? Food insecurity     Worry: Not on file     Inability: Not on file   ??? Transportation needs     Medical: Not on file     Non-medical: Not on file   Tobacco Use   ??? Smoking status: Never Smoker   ??? Smokeless tobacco: Never Used   Substance and Sexual Activity   ??? Alcohol use: Yes     Comment: occasional   ??? Drug use: Yes     Types: Marijuana     Comment: socially   ??? Sexual activity: Not on file   Lifestyle   ??? Physical activity     Days per week: Not on file     Minutes per session: Not on file   ??? Stress: Not on file   Relationships   ??? Social Product manager on phone: Not on file     Gets together: Not on file     Attends religious service: Not on file     Active member of club or organization: Not on file     Attends meetings of clubs or organizations: Not on file     Relationship status: Not on file   ??? Intimate partner violence     Fear of current or ex partner: Not on file      Emotionally abused: Not on file     Physically abused: Not on file     Forced sexual activity: Not on file   Other Topics Concern   ??? Not on file   Social History Narrative    ** Merged History Encounter **              ALLERGIES: Patient has no known allergies.    Review of Systems   Respiratory: Negative for shortness of breath.    Cardiovascular: Negative for chest pain.   Musculoskeletal: Positive for arthralgias (shoulder).   Skin: Negative for wound.   All other systems reviewed and are negative.      Vitals:  09/06/18 1009   BP: (!) 141/95   Pulse: 69   Resp: 20   SpO2: 100%   Weight: 78.9 kg (174 lb)   Height: 6\' 1"  (1.854 m)            Physical Exam  Vitals signs and nursing note reviewed.   Constitutional:       General: He is not in acute distress.     Appearance: He is well-developed.   HENT:      Head: Normocephalic and atraumatic.   Eyes:      General: No scleral icterus.  Cardiovascular:      Rate and Rhythm: Normal rate.   Pulmonary:      Effort: Pulmonary effort is normal.   Musculoskeletal:         General: No deformity or signs of injury.      Comments: Pain around the left shoulder with external rotation and abduction.  No visible deformity.  No bruising.  No soft tissue swelling.  Distal neurovascular preserved.  No trapezius or paracervical tenderness.   Skin:     General: Skin is warm and dry.   Neurological:      Mental Status: He is alert and oriented to person, place, and time.     X-ray left shoulder interpreted per myself as being within normal limits without acute pathologic findings.    MDM  Number of Diagnoses or Management Options  Arthralgia of left shoulder region:   Elevated blood pressure reading:   Diagnosis management comments: Impression: Chronic shoulder arthropathy.  Consider bursitis, rotator cuff tear, other tendinitis, ligamentous strain injury.  Screening x-ray negative.  Symptomatic treatment with orthopedic follow-up.         Procedures    Diagnosis:    1. Arthralgia of left shoulder region    2. Elevated blood pressure reading          Disposition: home    Follow-up Information     Follow up With Specialties Details Why Contact Info    Peggyann ShoalsWilson, Shawn A, DO Hand Surgery Schedule an appointment as soon as possible for a visit for recheck of ongoing symptoms [address]  [address]  [place] [id number]  [phone]      [facility]  Schedule an appointment as soon as possible for a visit for recheck of ongoing symptoms [address]  [address]  [place] [id number]  [phone]          Patient's Medications    No medications on file

## 2018-09-06 NOTE — ED Notes (Signed)
Tim Peterson is a 48 y.o. male that was discharged in stable condition.  The patients diagnosis, condition and treatment were explained to  patient and aftercare instructions were given.  The patient verbalized understanding. Patient armband removed and shredded.

## 2018-09-08 ENCOUNTER — Ambulatory Visit: Admit: 2018-09-08 | Payer: PRIVATE HEALTH INSURANCE | Attending: Orthopaedic Surgery | Primary: Nurse Practitioner

## 2018-09-08 ENCOUNTER — Ambulatory Visit: Attending: Orthopaedic Surgery | Primary: Nurse Practitioner

## 2018-09-08 DIAGNOSIS — M75102 Unspecified rotator cuff tear or rupture of left shoulder, not specified as traumatic: Secondary | ICD-10-CM

## 2018-09-08 MED ORDER — DICLOFENAC 50 MG TAB, DELAYED RELEASE
50 mg | ORAL_TABLET | Freq: Two times a day (BID) | ORAL | 1 refills | Status: DC
Start: 2018-09-08 — End: 2020-02-24
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
## 2018-09-15 ENCOUNTER — Inpatient Hospital Stay: Admit: 2018-09-15 | Payer: PRIVATE HEALTH INSURANCE | Attending: Orthopaedic Surgery | Primary: Nurse Practitioner

## 2018-09-15 DIAGNOSIS — M75102 Unspecified rotator cuff tear or rupture of left shoulder, not specified as traumatic: Secondary | ICD-10-CM

## 2018-09-25 ENCOUNTER — Ambulatory Visit
Admit: 2018-09-25 | Discharge: 2018-09-25 | Payer: PRIVATE HEALTH INSURANCE | Attending: Orthopaedic Surgery | Primary: Nurse Practitioner

## 2018-09-25 ENCOUNTER — Ambulatory Visit: Attending: Orthopaedic Surgery | Primary: Nurse Practitioner

## 2018-09-25 DIAGNOSIS — M75102 Unspecified rotator cuff tear or rupture of left shoulder, not specified as traumatic: Secondary | ICD-10-CM

## 2018-09-25 NOTE — Progress Notes (Signed)
Progress Notes by Jim LikeHuttman, Noni Stonesifer T, MD at 09/25/18 0820                Author: Jim LikeHuttman, Priscilla Finklea T, MD  Service: --  Author Type: Physician       Filed: 09/25/18 0826  Encounter Date: 09/25/2018  Status: Signed          Editor: Jim LikeHuttman, Layan Zalenski T, MD (Physician)                             Patient: Tim Peterson                MRN:  [id number]       SSN: [id number]   Date of Birth: 10-28-1970         AGE: 48 y.o.        SEX:  male   Body mass index is 20.95 kg/m??.      PCP: None   09/25/18      Chief Complaint: Left shoulder pain/MRI follow up      HPI: Tim Pastorntonio Clowers is a  48 y.o. male patient who returns today for his left shoulder MRI follow-up.  He reports  similar pain in his left shoulder.  He has not had any recent injuries or trauma.        Past Medical History:        Diagnosis  Date         ?  Elevated blood pressure reading       ?  Hypertension       ?  Kidney stones       ?  Pain in joint of right shoulder           ?  TB (tuberculosis), treated             No family history on file.        Current Outpatient Medications          Medication  Sig  Dispense  Refill           ?  diclofenac EC (VOLTAREN) 50 mg EC tablet  Take 1 Tab by mouth two (2) times daily (with meals).  60 Tab  1           ?  naproxen (NAPROSYN) 375 mg tablet  Take 1 Tab by mouth two (2) times daily (with meals).  30 Tab  0           No Known Allergies        Past Surgical History:         Procedure  Laterality  Date          ?  HX ORTHOPAEDIC              left leg pins             Social History          Socioeconomic History         ?  Marital status:  SINGLE              Spouse name:  Not on file         ?  Number of children:  Not on file     ?  Years of education:  Not on file     ?  Highest education level:  Not on file       Occupational History        ?  Not on file       Social Needs         ?  Financial resource strain:  Not on file        ?  Food insecurity              Worry:  Not on file         Inability:  Not on  file        ?  Transportation needs              Medical:  Not on file         Non-medical:  Not on file       Tobacco Use         ?  Smoking status:  Never Smoker     ?  Smokeless tobacco:  Never Used       Substance and Sexual Activity         ?  Alcohol use:  Yes             Comment: occasional         ?  Drug use:  Yes              Types:  Marijuana             Comment: socially         ?  Sexual activity:  Not on file       Lifestyle        ?  Physical activity              Days per week:  Not on file         Minutes per session:  Not on file         ?  Stress:  Not on file       Relationships        ?  Social Engineer, manufacturing systemsconnections              Talks on phone:  Not on file         Gets together:  Not on file         Attends religious service:  Not on file         Active member of club or organization:  Not on file         Attends meetings of clubs or organizations:  Not on file         Relationship status:  Not on file        ?  Intimate partner violence              Fear of current or ex partner:  Not on file         Emotionally abused:  Not on file         Physically abused:  Not on file         Forced sexual activity:  Not on file        Other Topics  Concern        ?  Not on file       Social History Narrative          ** Merged History Encounter **                      REVIEW OF SYSTEMS:        No changes from previous review of systems unless noted.      PHYSICAL  EXAMINATION:   Visit Vitals      BP  (!) 138/105 (BP 1 Location: Left arm, BP Patient Position: Sitting)     Pulse  79     Temp  96.9 ??F (36.1 ??C) (Oral)     Resp  16     Ht  6\' 1"  (1.854 m)     Wt  158 lb 12.8 oz (72 kg)     SpO2  99%        BMI  20.95 kg/m??        Body mass index is 20.95 kg/m??.   GENERAL: Alert and oriented x3, in no acute distress.   HEENT: Normocephalic, atraumatic.     RESP: Non labored breathing.   SKIN: No rashes or lesions noted.    Shoulder Examination      R   L   ROM    FF  Full   Full   ER  Full   Full    IR  Full   Full    Rotator Cuff Pain    Supra  -   =    Infra  -   -    Subscap -   -   Crepitus  -   -   Effusion  -   -   Warmth  -   -    Erythema  -   -   Instability  -   -   AC Joint TTP  -   -   Clavicle    Deformity -   -    TTP  -   -   Proximal Humerus    Deformity -   -    TTP  -   -   Deltoid Strength 5   5   Biceps Strength 5   5   Biceps Deformity -   -   Biceps Groove Pain -   -   Impingement Sign -   -          IMAGING:   An MRI of the left shoulder was reviewed in the office.  This does not show any full-thickness rotator cuff tears.  There is some tendinosis of the supraspinatus.         ASSESSMENT & PLAN   Diagnosis: Left shoulder rotator cuff syndrome      Agam is having left shoulder pain likely from his rotator cuff but I do not see anything that I would recommend any surgery on.  I recommended physical therapy for the next few weeks and then return to work.  I wrote him a note for work to return on  July 27.  He will follow-up as needed.         Electronically signed by: Waynetta Pean, MD

## 2018-09-25 NOTE — Progress Notes (Signed)
Patient: Tim Peterson                MRN: [id number]       SSN: [id number]  Date of Birth: 1970/12/04        AGE: 48 y.o.        SEX: male  Body mass index is 20.95 kg/m??.    PCP: None  09/25/18    Chief Complaint: Left shoulder pain/MRI follow up    HPI: Tim Pastorntonio Gallien is a 48 y.o. male patient who returns today for his left shoulder MRI follow-up.  He reports similar pain in his left shoulder.  He has not had any recent injuries or trauma.    Past Medical History:   Diagnosis Date   ??? Elevated blood pressure reading    ??? Hypertension    ??? Kidney stones    ??? Pain in joint of right shoulder    ??? TB (tuberculosis), treated        No family history on file.    Current Outpatient Medications   Medication Sig Dispense Refill   ??? diclofenac EC (VOLTAREN) 50 mg EC tablet Take 1 Tab by mouth two (2) times daily (with meals). 60 Tab 1   ??? naproxen (NAPROSYN) 375 mg tablet Take 1 Tab by mouth two (2) times daily (with meals). 30 Tab 0       No Known Allergies    Past Surgical History:   Procedure Laterality Date   ??? HX ORTHOPAEDIC      left leg pins       Social History     Socioeconomic History   ??? Marital status: SINGLE     Spouse name: Not on file   ??? Number of children: Not on file   ??? Years of education: Not on file   ??? Highest education level: Not on file   Occupational History   ??? Not on file   Social Needs   ??? Financial resource strain: Not on file   ??? Food insecurity     Worry: Not on file     Inability: Not on file   ??? Transportation needs     Medical: Not on file     Non-medical: Not on file   Tobacco Use   ??? Smoking status: Never Smoker   ??? Smokeless tobacco: Never Used   Substance and Sexual Activity   ??? Alcohol use: Yes     Comment: occasional   ??? Drug use: Yes     Types: Marijuana     Comment: socially   ??? Sexual activity: Not on file   Lifestyle   ??? Physical activity     Days per week: Not on file     Minutes per session: Not on file   ??? Stress: Not on file   Relationships   ??? Social Licensed conveyancerconnections      Talks on phone: Not on file     Gets together: Not on file     Attends religious service: Not on file     Active member of club or organization: Not on file     Attends meetings of clubs or organizations: Not on file     Relationship status: Not on file   ??? Intimate partner violence     Fear of current or ex partner: Not on file     Emotionally abused: Not on file     Physically abused: Not on file     Forced sexual activity: Not  on file   Other Topics Concern   ??? Not on file   Social History Narrative    ** Merged History Encounter **            REVIEW OF SYSTEMS:      No changes from previous review of systems unless noted.    PHYSICAL EXAMINATION:  Visit Vitals  BP (!) 138/105 (BP 1 Location: Left arm, BP Patient Position: Sitting)   Pulse 79   Temp 96.9 ??F (36.1 ??C) (Oral)   Resp 16   Ht 6\' 1"  (1.854 m)   Wt 158 lb 12.8 oz (72 kg)   SpO2 99%   BMI 20.95 kg/m??     Body mass index is 20.95 kg/m??.  GENERAL: Alert and oriented x3, in no acute distress.  HEENT: Normocephalic, atraumatic.    RESP: Non labored breathing.  SKIN: No rashes or lesions noted.   Shoulder Examination     R   L  ROM   FF  Full   Full  ER  Full   Full   IR  Full   Full  Rotator Cuff Pain   Supra  -   =   Infra  -   -   Subscap -   -  Crepitus  -   -  Effusion  -   -  Warmth  -   -   Erythema  -   -  Instability  -   -  AC Joint TTP  -   -  Clavicle   Deformity -   -   TTP  -   -  Proximal Humerus   Deformity -   -   TTP  -   -  Deltoid Strength 5   5  Biceps Strength 5   5  Biceps Deformity -   -  Biceps Groove Pain -   -  Impingement Sign -   -       IMAGING:  An MRI of the left shoulder was reviewed in the office.  This does not show any full-thickness rotator cuff tears.  There is some tendinosis of the supraspinatus.      ASSESSMENT & PLAN  Diagnosis: Left shoulder rotator cuff syndrome    Chike is having left shoulder pain likely from his rotator cuff but I do not see anything that I would recommend any surgery on.  I recommended  physical therapy for the next few weeks and then return to work.  I wrote him a note for work to return on July 27.  He will follow-up as needed.      Electronically signed by: Waynetta Pean, MD

## 2018-10-06 ENCOUNTER — Inpatient Hospital Stay: Admit: 2018-10-06 | Payer: PRIVATE HEALTH INSURANCE | Primary: Nurse Practitioner

## 2018-10-06 DIAGNOSIS — M75102 Unspecified rotator cuff tear or rupture of left shoulder, not specified as traumatic: Secondary | ICD-10-CM

## 2018-10-06 NOTE — Progress Notes (Signed)
 PT DAILY TREATMENT NOTE 10-18    Patient Name: [facility]  Date:10/06/2018  DOB: 1971-01-15  [x]   Patient DOB Verified  Payor: AETNA / Plan: [facility] AETNA PPO / Product Type: PPO /    In time:9:36  Out time:10:10  Total Treatment Time (min): 34  Visit #: 1 of 8      Treatment Area: Unspecified rotator cuff tear or rupture of left shoulder, not specified as traumatic [M75.102]    SUBJECTIVE  Pain Level (0-10 scale): 4  Any medication changes, allergies to medications, adverse drug reactions, diagnosis change, or new procedure performed?: [x]  No    []  Yes (see summary sheet for update)  Subjective functional status/changes:   []  No changes reported  The pt reports inability to lie on his left shoulder due to pain.     OBJECTIVE    Modality rationale: PD to improve the patient's ability to    Min Type Additional Details    []  Estim:  [] Unatt       [] IFC  [] Premod                        [] Other:  [] w/ice   [] w/heat  Position:  Location:    []  Estim: [] Att    [] TENS instruct  [] NMES                    [] Other:  [] w/US    [] w/ice   [] w/heat  Position:  Location:    []   Traction: []  Cervical       [] Lumbar                       []  Prone          [] Supine                       [] Intermittent   [] Continuous Lbs:  []  before manual  []  after manual    []   Ultrasound: [] Continuous   []  Pulsed                           []   [] W/cm2:  Location:    []   Iontophoresis with dexamethasone         Location: []  Take home patch   []  In clinic    []   Ice     []   heat  []   Ice massage  []   Laser   []   Anodyne Position:  Location:    []   Laser with stim  []   Other:  Position:  Location:    []   Vasopneumatic Device Pressure:       []  lo []  med []  hi   Temperature: []  lo []  med []  hi   []  Skin assessment post-treatment:  [] intact [] redness- no adverse reaction    [] redness - adverse reaction:     16 min [x] Eval                  [] Re-Eval       10 min Therapeutic Exercise:  []  See flow sheet :   Rationale: increase ROM and increase  strength to improve the patient's ability to improve ease of ADLs and self care    8 min Therapeutic Activity:  []   See flow sheet :   Rationale: self care and pt education  to improve the patient's ability to self manage symptoms  and maximize therapeutic effect         With   []  TE   []  TA   []  neuro   []  other: Patient Education: [x]  Review HEP    []  Progressed/Changed HEP based on:   []  positioning   []  body mechanics   []  transfers   []  heat/ice application    []  other:      Other Objective/Functional Measures:      Pain Level (0-10 scale) post treatment: 4    ASSESSMENT/Changes in Function: see POC    Patient will continue to benefit from skilled PT services to modify and progress therapeutic interventions, address functional mobility deficits, address ROM deficits, address strength deficits, analyze and address soft tissue restrictions, analyze and cue movement patterns and analyze and modify body mechanics/ergonomics to attain remaining goals.     [x]   See Plan of Care  []   See progress note/recertification  []   See Discharge Summary         Progress towards goals / Updated goals:  Short Term Goals: To be accomplished in 2 weeks:  1. Pt will demonstrate I and compliance with HEP to maximize therapeutic effect.   IE: HEP issued  2. Pt will demonstrate left shoulder abduction AROM to 150 deg without pain increase.   IE: 130 deg with pain  Long Term Goals: To be accomplished in 4 weeks:  1. Pt will demonstrate 5/5 left shoulder ER strength without pain to improve posterior cuff stability with work duties.   IE: 4+/5 with pain  2. Pt will report 75% improvement in sleeping ease to improve quality of life.   IE: 0%  3. The pt will demonstrate QP UE lifts without pain increase to improve ability to return to work duties.   IE: pain with WB'ing through left arm  4. Pt will improve FOTO score to 75 to demonstrate improved quality of life and function.   IE: 60    PLAN  []   Upgrade activities as tolerated     [x]    Continue plan of care  []   Update interventions per flow sheet       []   Discharge due to:_  []   Other:_      Prentice GORMAN Santee DPT, CMTPT 10/06/2018  12:38 PM    Future Appointments   Date Time Provider Department Center   10/08/2018 10:15 AM Maryjane Dorise HERO, PTA [facility]   10/12/2018 11:45 AM Shona Alan POUR, PTA [facility]   10/14/2018 10:30 AM [place]ee Prentice GORMAN [facility]   10/20/2018  9:15 AM Shona Alan POUR, PTA [facility]   10/22/2018 10:30 AM Shona Alan POUR, PTA [facility]   10/26/2018  9:45 AM Santee Prentice GORMAN [facility]   10/28/2018  9:00 AM Shona Alan POUR, PTA [facility]   11/02/2018  9:45 AM Santee Prentice GORMAN [facility]   11/04/2018  9:15 AM Santee Prentice GORMAN [facility]

## 2018-10-06 NOTE — Progress Notes (Signed)
[facility] ??? [facility]  [place] [address]  [phone]  [phone] fax    Plan of Care/ Statement of Necessity for Physical Therapy Services    Patient name: [facility] Start of Care: 10/06/2018   Referral source: Waynetta Pean, MD DOB: March 29, 1970    Medical Diagnosis: Unspecified rotator cuff tear or rupture of left shoulder, not specified as traumatic [M75.102]  Payor: AETNA / Plan: [facility] AETNA PPO / Product Type: PPO /  Onset Date:2 months    Treatment Diagnosis: Left shoulder pain   Prior Hospitalization: see medical history Provider#: [id number]   Medications: Verified on Patient summary List    Comorbidities: left shoulder dislocation (10 years)   Prior Level of Function: Pt RHD male independent with ADLs and work duties     The Plan of Care and following information is based on the information from the initial evaluation.  Assessment/ key information: The pt is a 48 y/o M presenting with c/o left shoulder pain for the past two months after falling in the bathroom. He reports landing on his left shoulder and has noticed some continued pain since. He did have X-rays that were negative and an MRI which showed supraspinatus tendinopathy. The pt shows good overall shoulder mobility, some soreness at medial supraspinatus/UT region with ABD and functional IR. Good overall strength noted through shoulder, some pain with ER. He does have a (+) O'Briens with pain limiting, (-) Speeds. Negative radicular cervical screen with some UT region discomfort on left with Spurling's. Pt signs and symptoms appear consistent with left shoulder strain. He would benefit from PT to improve pain, strength and ADL ease.    Evaluation Complexity History MEDIUM  Complexity : 1-2 comorbidities / personal factors will impact the outcome/ POC ; Examination MEDIUM Complexity : 3 Standardized tests and measures addressing body structure,  function, activity limitation and / or participation in recreation  ;Presentation LOW Complexity : Stable, uncomplicated  ;Clinical Decision Making MEDIUM Complexity : FOTO score of 26-74  Overall Complexity Rating: LOW   Problem List: pain affecting function, decrease strength, decrease ADL/ functional abilitiies, decrease activity tolerance and decrease flexibility/ joint mobility   Treatment Plan may include any combination of the following: Therapeutic exercise, Therapeutic activities, Neuromuscular re-education, Physical agent/modality, Manual therapy, Patient education, Self Care training and Functional mobility training  Patient / Family readiness to learn indicated by: asking questions and trying to perform skills  Persons(s) to be included in education: patient (P)  Barriers to Learning/Limitations: None  Patient Goal (s): ???Get pain where I can deal with it and get back to work???  Patient Self Reported Health Status: good  Rehabilitation Potential: good    Short Term Goals: To be accomplished in 2 weeks:  1. Pt will demonstrate I and compliance with HEP to maximize therapeutic effect.  2. Pt will demonstrate left shoulder abduction AROM to 150 deg without pain increase.  Long Term Goals: To be accomplished in 4 weeks:  1. Pt will demonstrate 5/5 left shoulder ER strength without pain to improve posterior cuff stability with work duties.  2. Pt will report 75% improvement in sleeping ease to improve quality of life.  3. The pt will demonstrate QP UE lifts without pain increase to improve ability to return to work duties.  4. Pt will improve FOTO score to 75 to demonstrate improved quality of life and function.    Frequency / Duration: Patient to be  seen 2 times per week for 4 weeks.    Patient/ Caregiver education and instruction: Diagnosis, prognosis, self care, activity modification and exercises   [x]   Plan of care has been reviewed with PTA    Stevan BornAndrew S Racheal Mathurin DPT, CMTPT 10/06/2018 10:18 AM     ________________________________________________________________________    I certify that the above Therapy Services are being furnished while the patient is under my care. I agree with the treatment plan and certify that this therapy is necessary.    Physician's Signature:____________Date:_________TIME:________    [employer]** Signature, Date and Time must be completed for valid certification **    Please sign and return to [facility] ??? [facility]  [address]  [place], [place] [id number]  [phone]  [phone] fax

## 2018-10-06 NOTE — Progress Notes (Signed)
PT DAILY TREATMENT NOTE 10-18    Patient Name: Tim Peterson  Date:10/06/2018  DOB: 1970-10-21  [x]   Patient DOB Verified  Payor: AETNA / Plan: [facility]SHSI AETNA PPO / Product Type: PPO /    In time:9:36  Out time:10:10  Total Treatment Time (min): 34  Visit #: 1 of 8      Treatment Area: Unspecified rotator cuff tear or rupture of left shoulder, not specified as traumatic [M75.102]    SUBJECTIVE  Pain Level (0-10 scale): 4  Any medication changes, allergies to medications, adverse drug reactions, diagnosis change, or new procedure performed?: [x]  No    []  Yes (see summary sheet for update)  Subjective functional status/changes:   []  No changes reported  Tim pt reports inability to lie on his left shoulder due to pain.     OBJECTIVE    Modality rationale: PD to improve Tim patient???s ability to    Min Type Additional Details    []  Estim:  [] Unatt       [] IFC  [] Premod                        [] Other:  [] w/ice   [] w/heat  Position:  Location:    []  Estim: [] Att    [] TENS instruct  [] NMES                    [] Other:  [] w/US   [] w/ice   [] w/heat  Position:  Location:    []   Traction: []  Cervical       [] Lumbar                       []  Prone          [] Supine                       [] Intermittent   [] Continuous Lbs:  []  before manual  []  after manual    []   Ultrasound: [] Continuous   []  Pulsed                           [] 1MHz   [] 3MHz W/cm2:  Location:    []   Iontophoresis with dexamethasone         Location: []  Take home patch   []  In clinic    []   Ice     []   heat  []   Ice massage  []   Laser   []   Anodyne Position:  Location:    []   Laser with stim  []   Other:  Position:  Location:    []   Vasopneumatic Device Pressure:       []  lo []  med []  hi   Temperature: []  lo []  med []  hi   []  Skin assessment post-treatment:  [] intact [] redness- no adverse reaction    [] redness ??? adverse reaction:     16 min [x] Eval                  [] Re-Eval       10 min Therapeutic Exercise:  []  See flow sheet :    Rationale: increase ROM and increase strength to improve Tim patient???s ability to improve ease of ADLs and self care    8 min Therapeutic Activity:  []   See flow sheet :   Rationale: self care and pt education  to improve Tim patient???s ability to self manage symptoms  and maximize therapeutic effect         With   []  TE   []  TA   []  neuro   []  other: Patient Education: [x]  Review HEP    []  Progressed/Changed HEP based on:   []  positioning   []  body mechanics   []  transfers   []  heat/ice application    []  other:      Other Objective/Functional Measures:      Pain Level (0-10 scale) post treatment: 4    ASSESSMENT/Changes in Function: see POC    Patient will continue to benefit from skilled PT services to modify and progress therapeutic interventions, address functional mobility deficits, address ROM deficits, address strength deficits, analyze and address soft tissue restrictions, analyze and cue movement patterns and analyze and modify body mechanics/ergonomics to attain remaining goals.     [x]   See Plan of Care  []   See progress note/recertification  []   See Discharge Summary         Progress towards goals / Updated goals:  Short Term Goals: To be accomplished in 2 weeks:  1. Pt will demonstrate I and compliance with HEP to maximize therapeutic effect.   IE: HEP issued  2. Pt will demonstrate left shoulder abduction AROM to 150 deg without pain increase.   IE: 130 deg with pain  Long Term Goals: To be accomplished in 4 weeks:  1. Pt will demonstrate 5/5 left shoulder ER strength without pain to improve posterior cuff stability with work duties.   IE: 4+/5 with pain  2. Pt will report 75% improvement in sleeping ease to improve quality of life.   IE: 0%  3. Tim pt will demonstrate QP UE lifts without pain increase to improve ability to return to work duties.   IE: pain with WB'ing through left arm  4. Pt will improve FOTO score to 75 to demonstrate improved quality of life and function.   IE: 71    PLAN   []   Upgrade activities as tolerated     [x]   Continue plan of care  []   Update interventions per flow sheet       []   Discharge due to:_  []   Other:_      Tim Peterson DPT, CMTPT 10/06/2018  12:38 PM    Future Appointments   Date Time Provider [place]   10/08/2018 10:15 AM Carrolyn Leigh, PTA [facility]   10/12/2018 11:45 AM Hardie Shackleton, PTA [facility]   10/14/2018 10:30 AM Tim Peterson [facility]   10/20/2018  9:15 AM Hardie Shackleton, PTA [facility]   10/22/2018 10:30 AM Hardie Shackleton, PTA [facility]   10/26/2018  9:45 AM Tim Peterson [facility]   10/28/2018  9:00 AM Hardie Shackleton, PTA [facility]   11/02/2018  9:45 AM Tim Peterson [facility]   11/04/2018  9:15 AM Tim Peterson [facility]

## 2018-10-06 NOTE — Progress Notes (Signed)
 [facility]  [address] [place] [address]  [place], [place] [address]  [phone]  [phone] fax    Plan of Care/ Statement of Necessity for Physical Therapy Services    Patient name: [facility] Start of Care: 10/06/2018   Referral source: Mee Toribio DASEN, MD DOB: 17-Sep-1970    Medical Diagnosis: Unspecified rotator cuff tear or rupture of left shoulder, not specified as traumatic [M75.102]  Payor: AETNA / Plan: [employer] AETNA PPO / Product Type: PPO /  Onset Date:2 months    Treatment Diagnosis: Left shoulder pain   Prior Hospitalization: see medical history Provider#: [id number]   Medications: Verified on Patient summary List    Comorbidities: left shoulder dislocation (10 years)   Prior Level of Function: Pt RHD male independent with ADLs and work duties     The Plan of Care and following information is based on the information from the initial evaluation.  Assessment/ key information: The pt is a 48 y/o M presenting with c/o left shoulder pain for the past two months after falling in the bathroom. He reports landing on his left shoulder and has noticed some continued pain since. He did have X-rays that were negative and an MRI which showed supraspinatus tendinopathy. The pt shows good overall shoulder mobility, some soreness at medial supraspinatus/UT region with ABD and functional IR. Good overall strength noted through shoulder, some pain with ER. He does have a (+) O'Briens with pain limiting, (-) Speeds. Negative radicular cervical screen with some UT region discomfort on left with Spurling's. Pt signs and symptoms appear consistent with left shoulder strain. He would benefit from PT to improve pain, strength and ADL ease.    Evaluation Complexity History MEDIUM  Complexity : 1-2 comorbidities / personal factors will impact the outcome/ POC ; Examination MEDIUM Complexity : 3 Standardized tests and measures addressing body structure, function, activity limitation and / or  participation in recreation  ;Presentation LOW Complexity : Stable, uncomplicated  ;Clinical Decision Making MEDIUM Complexity : FOTO score of 26-74  Overall Complexity Rating: LOW   Problem List: pain affecting function, decrease strength, decrease ADL/ functional abilitiies, decrease activity tolerance and decrease flexibility/ joint mobility   Treatment Plan may include any combination of the following: Therapeutic exercise, Therapeutic activities, Neuromuscular re-education, Physical agent/modality, Manual therapy, Patient education, Self Care training and Functional mobility training  Patient / Family readiness to learn indicated by: asking questions and trying to perform skills  Persons(s) to be included in education: patient (P)  Barriers to Learning/Limitations: None  Patient Goal (s): "Get pain where I can deal with it and get back to work"  Patient Self Reported Health Status: good  Rehabilitation Potential: good    Short Term Goals: To be accomplished in 2 weeks:  1. Pt will demonstrate I and compliance with HEP to maximize therapeutic effect.  2. Pt will demonstrate left shoulder abduction AROM to 150 deg without pain increase.  Long Term Goals: To be accomplished in 4 weeks:  1. Pt will demonstrate 5/5 left shoulder ER strength without pain to improve posterior cuff stability with work duties.  2. Pt will report 75% improvement in sleeping ease to improve quality of life.  3. The pt will demonstrate QP UE lifts without pain increase to improve ability to return to work duties.  4. Pt will improve FOTO score to 75 to demonstrate improved quality of life and function.    Frequency / Duration: Patient to be  seen 2 times per week for 4 weeks.    Patient/ Caregiver education and instruction: Diagnosis, prognosis, self care, activity modification and exercises   [x]   Plan of care has been reviewed with PTA    Prentice GORMAN Santee DPT, CMTPT 10/06/2018 10:18  AM    ________________________________________________________________________    I certify that the above Therapy Services are being furnished while the patient is under my care. I agree with the treatment plan and certify that this therapy is necessary.    Physician's Signature:____________Date:_________TIME:________    ** Signature, Date and Time must be completed for valid certification **    Please sign and return to [facility]  [address] [place] [address]  [place], [place] [address]  [phone]  [phone] fax

## 2018-10-08 ENCOUNTER — Inpatient Hospital Stay: Admit: 2018-10-08 | Payer: PRIVATE HEALTH INSURANCE | Primary: Nurse Practitioner

## 2018-10-08 NOTE — Progress Notes (Signed)
PT DAILY TREATMENT NOTE 10-18    Patient Name: [facility]  Date:10/08/2018  DOB: [birth year]  [x]   Patient DOB Verified  Payor: Monia Pouch / Plan: [employer] AETNA PPO / Product Type: PPO /    In time:202  Out time:238  Total Treatment Time (min): 36  Visit #: 2 of 8    Treatment Area: Unspecified rotator cuff tear or rupture of left shoulder, not specified as traumatic [M75.102]    SUBJECTIVE  Pain Level (0-10 scale): 2  Any medication changes, allergies to medications, adverse drug reactions, diagnosis change, or new procedure performed?: [x]  No    []  Yes (see summary sheet for update)  Subjective functional status/changes:   []  No changes reported  'I've been feeling better since I started my exercises."    OBJECTIVE    Modality rationale: patient declined   Min Type Additional Details    []  Estim:  [] Unatt       [] IFC  [] Premod                        [] Other:  [] w/ice   [] w/heat  Position:  Location:    []  Estim: [] Att    [] TENS instruct  [] NMES                    [] Other:  [] w/US   [] w/ice   [] w/heat  Position:  Location:    []   Traction: []  Cervical       [] Lumbar                       []  Prone          [] Supine                       [] Intermittent   [] Continuous Lbs:  []  before manual  []  after manual    []   Ultrasound: [] Continuous   []  Pulsed                           []   [] W/cm2:  Location:    []   Iontophoresis with dexamethasone         Location: []  Take home patch   []  In clinic    []   Ice     []   heat  []   Ice massage  []   Laser   []   Anodyne Position:  Location:    []   Laser with stim  []   Other:  Position:  Location:    []   Vasopneumatic Device Pressure:       []  lo []  med []  hi   Temperature: []  lo []  med []  hi   []  Skin assessment post-treatment:  [] intact [] redness- no adverse reaction    [] redness - adverse reaction:     10 min Therapeutic Exercise:  [x]  See flow sheet :   Rationale: increase ROM and increase strength to improve the patient's ability to perform ADLs    18 min Neuromuscular  Re-education:  [x]   See flow sheet : scap re-ed activities   Rationale: increase ROM, increase strength, improve coordination, improve balance and increase proprioception  to improve the patient's ability to improve mobility, upright posture, and reaching    8 min Manual Therapy:  STM to medial border of left scapula and left UT in sidelying, grade 2 & 3 mobs to left scapula. Subscap release to left subscap.  Rationale: decrease pain, increase ROM, increase tissue extensibility, decrease trigger points and increase postural awareness to improve mobility and reaching         With   [x]  TE   []  TA   [x]  neuro   []  other: Patient Education: [x]  Review HEP    []  Progressed/Changed HEP based on:   [x]  positioning   [x]  body mechanics   []  transfers   []  heat/ice application    []  other:      Other Objective/Functional Measures:      Pain Level (0-10 scale) post treatment: 0    ASSESSMENT/Changes in Function: Initiated POC to which pt responded well. Challenged with ER walkout and would benefit from continued strengthening. He reports a reduction in pain following treatment and demonstrated improved AROM following manual.    Patient will continue to benefit from skilled PT services to modify and progress therapeutic interventions, address functional mobility deficits, address ROM deficits, address strength deficits, analyze and address soft tissue restrictions, analyze and cue movement patterns, analyze and modify body mechanics/ergonomics, assess and modify postural abnormalities, address imbalance/dizziness and instruct in home and community integration to attain remaining goals.     [x]   See Plan of Care  []   See progress note/recertification  []   See Discharge Summary         Progress towards goals / Updated goals:  Short Term Goals:To be accomplished in 2weeks:  1. Pt will demonstrate I and compliance with HEP to maximize therapeutic effect.              IE: HEP issued  2. Pt will demonstrate left shoulder  abduction AROM to 150 deg without pain increase.              IE: 130 deg with pain  Long Term Goals:To be accomplished in 4weeks:  1. Pt will demonstrate 5/5 left shoulder ER strength without pain to improve posterior cuff stability with work duties.              IE: 4+/5 with pain  2. Pt will report 75% improvement in sleeping ease to improve quality of life.              IE: 0%  3.The pt will demonstrate QP UE lifts without pain increase to improve ability to return to work duties.              IE: pain with WB'ing through left arm  4. Pt will improve FOTO score to 75 to demonstrate improved quality of life and function.              IE: 60    PLAN  []   Upgrade activities as tolerated     [x]   Continue plan of care  []   Update interventions per flow sheet       []   Discharge due to:_  []   Other:_      Ernst Spell, PTA, CSCS 10/08/2018  2:50 PM    Future Appointments   Date Time Provider Department Center   10/12/2018 11:45 AM Nickola Major B, PT [facility]   10/14/2018 10:30 AM Stevan Born [facility]   10/20/2018  9:15 AM Laurence Compton, PTA [facility]   10/22/2018 10:30 AM Laurence Compton, PTA [facility]   10/26/2018  9:45 AM Stevan Born [facility]   10/28/2018  9:00 AM Laurence Compton, PTA [facility]   11/02/2018  9:45 AM Stevan Born  [facility]   11/04/2018  9:15 AM Stevan Born [facility]

## 2018-10-08 NOTE — Progress Notes (Signed)
PT DAILY TREATMENT NOTE 10-18    Patient Name: [facility]  Date:10/08/2018  DOB: 08/30/1970  [x]   Patient DOB Verified  Payor: AETNA / Plan: [employer] AETNA PPO / Product Type: PPO /    In time:202  Out time:238  Total Treatment Time (min): 36  Visit #: 2 of 8    Treatment Area: Unspecified rotator cuff tear or rupture of left shoulder, not specified as traumatic [M75.102]    SUBJECTIVE  Pain Level (0-10 scale): 2  Any medication changes, allergies to medications, adverse drug reactions, diagnosis change, or new procedure performed?: [x]  No    []  Yes (see summary sheet for update)  Subjective functional status/changes:   []  No changes reported  'I've been feeling better since I started my exercises."    OBJECTIVE    Modality rationale: patient declined   Min Type Additional Details    []  Estim:  [] Unatt       [] IFC  [] Premod                        [] Other:  [] w/ice   [] w/heat  Position:  Location:    []  Estim: [] Att    [] TENS instruct  [] NMES                    [] Other:  [] w/US   [] w/ice   [] w/heat  Position:  Location:    []   Traction: []  Cervical       [] Lumbar                       []  Prone          [] Supine                       [] Intermittent   [] Continuous Lbs:  []  before manual  []  after manual    []   Ultrasound: [] Continuous   []  Pulsed                           [] 1MHz   [] 3MHz W/cm2:  Location:    []   Iontophoresis with dexamethasone         Location: []  Take home patch   []  In clinic    []   Ice     []   heat  []   Ice massage  []   Laser   []   Anodyne Position:  Location:    []   Laser with stim  []   Other:  Position:  Location:    []   Vasopneumatic Device Pressure:       []  lo []  med []  hi   Temperature: []  lo []  med []  hi   []  Skin assessment post-treatment:  [] intact [] redness- no adverse reaction    [] redness ??? adverse reaction:     10 min Therapeutic Exercise:  [x]  See flow sheet :   Rationale: increase ROM and increase strength to improve the patient???s ability to perform ADLs     18 min Neuromuscular Re-education:  [x]   See flow sheet : scap re-ed activities   Rationale: increase ROM, increase strength, improve coordination, improve balance and increase proprioception  to improve the patient???s ability to improve mobility, upright posture, and reaching    8 min Manual Therapy:  STM to medial border of left scapula and left UT in sidelying, grade 2 & 3 mobs to left scapula. Subscap release to left subscap.  Rationale: decrease pain, increase ROM, increase tissue extensibility, decrease trigger points and increase postural awareness to improve mobility and reaching         With   [x]  TE   []  TA   [x]  neuro   []  other: Patient Education: [x]  Review HEP    []  Progressed/Changed HEP based on:   [x]  positioning   [x]  body mechanics   []  transfers   []  heat/ice application    []  other:      Other Objective/Functional Measures:      Pain Level (0-10 scale) post treatment: 0    ASSESSMENT/Changes in Function: Initiated POC to which pt responded well. Challenged with ER walkout and would benefit from continued strengthening. He reports a reduction in pain following treatment and demonstrated improved AROM following manual.    Patient will continue to benefit from skilled PT services to modify and progress therapeutic interventions, address functional mobility deficits, address ROM deficits, address strength deficits, analyze and address soft tissue restrictions, analyze and cue movement patterns, analyze and modify body mechanics/ergonomics, assess and modify postural abnormalities, address imbalance/dizziness and instruct in home and community integration to attain remaining goals.     [x]   See Plan of Care  []   See progress note/recertification  []   See Discharge Summary         Progress towards goals / Updated goals:  Short Term Goals:??To be accomplished in 2??weeks:  1. Pt will demonstrate I and compliance with HEP to maximize therapeutic effect.              IE: HEP issued   2. Pt will demonstrate left shoulder abduction AROM to 150 deg without pain increase.              IE: 130 deg with pain  Long Term Goals:??To be accomplished in 4??weeks:  1. Pt will demonstrate 5/5 left shoulder ER strength without pain to improve posterior cuff stability with work duties.              IE: 4+/5 with pain  2. Pt will report 75% improvement in sleeping ease to improve quality of life.              IE: 0%  3.??The pt will demonstrate QP UE lifts without pain increase to improve ability to return to work duties.              IE: pain with WB'ing through left arm  4. Pt will improve FOTO score to 75 to demonstrate improved quality of life and function.              IE: 60    PLAN  []   Upgrade activities as tolerated     [x]   Continue plan of care  []   Update interventions per flow sheet       []   Discharge due to:_  []   Other:_      Ernst SpellBryan M Margues Filippini, PTA, CSCS 10/08/2018  2:50 PM    Future Appointments   Date Time Provider Department Center   10/12/2018 11:45 AM Nickola MajorEdwards, Lisa B, PT [facility]   10/14/2018 10:30 AM Stevan BornAshby, Andrew S [facility]   10/20/2018  9:15 AM Laurence Comptonatalano, Amanda K, PTA [facility]   10/22/2018 10:30 AM Laurence Comptonatalano, Amanda K, PTA [facility]   10/26/2018  9:45 AM Stevan BornAshby, Andrew S [facility]   10/28/2018  9:00 AM Laurence Comptonatalano, Amanda K, PTA [facility]   11/02/2018  9:45 AM Stevan BornAshby, Andrew S  [facility]   11/04/2018  9:15 AM Stevan BornAshby, Andrew S [facility]

## 2018-10-12 ENCOUNTER — Inpatient Hospital Stay: Admit: 2018-10-12 | Payer: PRIVATE HEALTH INSURANCE | Primary: Nurse Practitioner

## 2018-10-12 NOTE — Progress Notes (Signed)
 PT DAILY TREATMENT NOTE 10-18    Patient Name: Tim Peterson  Date:10/12/2018  DOB: January 16, 1971  [x]   Patient DOB Verified  Payor: HULAN / Plan: [employer] AETNA PPO / Product Type: PPO /    In time:1147  Out time:1223  Total Treatment Time (min): 36  Visit #: 3 of 8    Treatment Area: Unspecified rotator cuff tear or rupture of left shoulder, not specified as traumatic [M75.102]    SUBJECTIVE  Pain Level (0-10 scale): 5-6  Any medication changes, allergies to medications, adverse drug reactions, diagnosis change, or new procedure performed?: [x]  No    []  Yes (see summary sheet for update)  Subjective functional status/changes:   []  No changes reported  I lifted a cooler over the weekend and it's been hurting ever since.     OBJECTIVE    Modality rationale: decrease pain and increase tissue extensibility to improve the patient's ability to restore proper mechanics to the shoulder for ADLs   Min Type Additional Details    []  Estim:  [] Unatt       [] IFC  [] Premod                        [] Other:  [] w/ice   [] w/heat  Position:  Location:   8 [x]  Estim: [x] Att    [] TENS instruct  [] NMES                    [] Other:  [x] w/US    [] w/ice   [] w/heat  Position:s/l  Location:left UT/supraspinatus    []   Traction: []  Cervical       [] Lumbar                       []  Prone          [] Supine                       [] Intermittent   [] Continuous Lbs:  []  before manual  []  after manual    []   Ultrasound: [] Continuous   []  Pulsed                           []   [] W/cm2:  Location:    []   Iontophoresis with dexamethasone         Location: []  Take home patch   []  In clinic    []   Ice     []   heat  []   Ice massage  []   Laser   []   Anodyne Position:  Location:    []   Laser with stim  []   Other:  Position:  Location:    []   Vasopneumatic Device Pressure:       []  lo []  med []  hi   Temperature: []  lo []  med []  hi   []  Skin assessment post-treatment:  [] intact [] redness- no adverse reaction        12 min Therapeutic Exercise:  []  See flow  sheet :   Rationale: increase ROM and increase strength to improve the patient's ability to perform daily activities      8 min Neuromuscular Re-education:  []   See flow sheet :   Rationale: increase strength  to improve the patient's scapular stability for overhead lifting.    8 min Manual Therapy:  Grade 4 ST/GHJ mobs/ STM to pec and subscap; PROM with distraction    Rationale: decrease  pain, increase ROM and increase tissue extensibility to restore proper mechanics to shoulder for daily activities        With   []  TE   []  TA   []  neuro   []  other: Patient Education: [x]  Review HEP    []  Progressed/Changed HEP based on:   []  positioning   []  body mechanics   []  transfers   []  heat/ice application    []  other:      Other Objective/Functional Measures:      Pain Level (0-10 scale) post treatment: 1    ASSESSMENT/Changes in Function: Added combo today; patient felt a decrease in pain with both combo and manual.  Scapular mobility improved significantly post manual.     Patient will continue to benefit from skilled PT services to modify and progress therapeutic interventions, address functional mobility deficits, address ROM deficits, address strength deficits, analyze and address soft tissue restrictions and analyze and cue movement patterns to attain remaining goals.     []   See Plan of Care  []   See progress note/recertification  []   See Discharge Summary         Progress towards goals / Updated goals:  Short Term Goals:To be accomplished in 2weeks:  1. Pt will demonstrate I and compliance with HEP to maximize therapeutic effect.  IE: HEP issued   Current: reports compliance 10/12/18  2. Pt will demonstrate left shoulder abduction AROM to 150 deg without pain increase.  IE: 130 deg with pain  Long Term Goals:To be accomplished in 4weeks:  1. Pt will demonstrate 5/5 left shoulder ER strength without pain to improve posterior cuff stability with work duties.  IE: 4+/5 with  pain  2. Pt will report 75% improvement in sleeping ease to improve quality of life.  IE: 0%  3.The pt will demonstrate QP UE lifts without pain increase to improve ability to return to work duties.  IE: pain with WB'ing through left arm  4. Pt will improve FOTO score to 75 to demonstrate improved quality of life and function.  IE: 60    PLAN  []   Upgrade activities as tolerated     [x]   Continue plan of care  []   Update interventions per flow sheet       []   Discharge due to:_  []   Other:_      Olam Bohr, MPT, CMTPT 10/12/2018  10:42 AM    Future Appointments   Date Time Provider Department Center   10/12/2018 11:45 AM Bohr Olam B, PT [facility]   10/14/2018 10:30 AM Almyra Prentice RAMAN [facility]   10/20/2018  9:15 AM Shona Alan POUR, PTA [facility]   10/22/2018 10:30 AM Shona Alan POUR, PTA [facility]   10/26/2018  9:45 AM Almyra Prentice RAMAN [facility]   10/28/2018  9:00 AM Shona Alan POUR, PTA [facility]   11/02/2018  9:45 AM Almyra Prentice RAMAN [facility]   11/04/2018  9:15 AM Almyra Prentice RAMAN [facility]

## 2018-10-12 NOTE — Telephone Encounter (Signed)
He should continue PT until that is finished. He can return to work at this time, No restrictions.

## 2018-10-12 NOTE — Telephone Encounter (Signed)
Patient just began physical therapy last Tuesday and was to attend 3 sessions before going back to work.  His rtw letter had him going back today, however, his employer needs to confirm patient has been released.  Please contact patient to discuss if he's able to return to work this week and also if patient has any limitations upon return to work.  Patient can be reached at [phone].

## 2018-10-12 NOTE — Progress Notes (Signed)
PT DAILY TREATMENT NOTE 10-18    Patient Name: Tim Peterson  Date:10/12/2018  DOB: [birth year]  [x]   Patient DOB Verified  Payor: AETNA / Plan: [facility] [employer]ET[employer] PPO / Product Type: PPO /    In time:1147  Out time:1223  Total Treatment Time (min): 36  Visit #: 3 of 8    Treatment Area: Unspecified rotator cuff tear or rupture of left shoulder, not specified as traumatic [M75.102]    SUBJECTIVE  Pain Level (0-10 scale): 5-6  Any medication changes, allergies to medications, adverse drug reactions, diagnosis change, or new procedure performed?: [x]  No    []  Yes (see summary sheet for update)  Subjective functional status/changes:   []  No changes reported  I lifted a cooler over the weekend and it's been hurting ever since.     OBJECTIVE    Modality rationale: decrease pain and increase tissue extensibility to improve the patient???s ability to restore proper mechanics to the shoulder for ADLs   Min Type Additional Details    []  Estim:  [] Unatt       [] IFC  [] Premod                        [] Other:  [] w/ice   [] w/heat  Position:  Location:   8 [x]  Estim: [x] Att    [] TENS instruct  [] NMES                    [] Other:  [x] w/US   [] w/ice   [] w/heat  Position:s/l  Location:left UT/supraspinatus    []   Traction: []  Cervical       [] Lumbar                       []  Prone          [] Supine                       [] Intermittent   [] Continuous Lbs:  []  before manual  []  after manual    []   Ultrasound: [] Continuous   []  Pulsed                           [] 1MHz   [] 3MHz W/cm2:  Location:    []   Iontophoresis with dexamethasone         Location: []  Take home patch   []  In clinic    []   Ice     []   heat  []   Ice massage  []   Laser   []   Anodyne Position:  Location:    []   Laser with stim  []   Other:  Position:  Location:    []   Vasopneumatic Device Pressure:       []  lo []  med []  hi   Temperature: []  lo []  med []  hi   []  Skin assessment post-treatment:  [] intact [] redness- no adverse reaction         12 min Therapeutic Exercise:  []  See flow sheet :   Rationale: increase ROM and increase strength to improve the patient???s ability to perform daily activities      8 min Neuromuscular Re-education:  []   See flow sheet :   Rationale: increase strength  to improve the patient???s scapular stability for overhead lifting.    8 min Manual Therapy:  Grade 4 ST/GHJ mobs/ STM to pec and subscap; PROM with distraction    Rationale: decrease  pain, increase ROM and increase tissue extensibility to restore proper mechanics to shoulder for daily activities        With   []  TE   []  TA   []  neuro   []  other: Patient Education: [x]  Review HEP    []  Progressed/Changed HEP based on:   []  positioning   []  body mechanics   []  transfers   []  heat/ice application    []  other:      Other Objective/Functional Measures:      Pain Level (0-10 scale) post treatment: 1    ASSESSMENT/Changes in Function: Added combo today; patient felt a decrease in pain with both combo and manual.  Scapular mobility improved significantly post manual.     Patient will continue to benefit from skilled PT services to modify and progress therapeutic interventions, address functional mobility deficits, address ROM deficits, address strength deficits, analyze and address soft tissue restrictions and analyze and cue movement patterns to attain remaining goals.     []   See Plan of Care  []   See progress note/recertification  []   See Discharge Summary         Progress towards goals / Updated goals:  Short Term Goals:??To be accomplished in 2??weeks:  1. Pt will demonstrate I and compliance with HEP to maximize therapeutic effect.  ????????????????????????IE: HEP issued   Current: reports compliance 10/12/18  2. Pt will demonstrate left shoulder abduction AROM to 150 deg without pain increase.  ????????????????????????IE: 130 deg with pain  Long Term Goals:??To be accomplished in 4??weeks:  1. Pt will demonstrate 5/5 left shoulder ER strength without pain to  improve posterior cuff stability with work duties.  ????????????????????????IE: 4+/5 with pain  2. Pt will report 75% improvement in sleeping ease to improve quality of life.  ????????????????????????IE: 0%  3.??The pt will demonstrate QP UE lifts without pain increase to improve ability to return to work duties.  ????????????????????????IE: pain with WB'ing through left arm  4. Pt will improve FOTO score to 75 to demonstrate improved quality of life and function.  ????????????????????????IE: 60    PLAN  []   Upgrade activities as tolerated     [x]   Continue plan of care  []   Update interventions per flow sheet       []   Discharge due to:_  []   Other:_      Roselyn Meier, MPT, CMTPT 10/12/2018  10:42 AM    Future Appointments   Date Time Provider [place]   10/12/2018 11:45 AM Roselyn Meier B, PT [facility]   10/14/2018 10:30 AM Mauricio Po [facility]   10/20/2018  9:15 AM Hardie Shackleton, PTA [facility]   10/22/2018 10:30 AM Hardie Shackleton, PTA [facility]   10/26/2018  9:45 AM Mauricio Po [facility]   10/28/2018  9:00 AM Hardie Shackleton, PTA [facility]   11/02/2018  9:45 AM Mauricio Po [facility]   11/04/2018  9:15 AM Mauricio Po [facility]

## 2018-10-12 NOTE — Telephone Encounter (Signed)
Patient advised he can pick his work note up at his convenience.

## 2018-10-14 ENCOUNTER — Inpatient Hospital Stay: Admit: 2018-10-14 | Payer: PRIVATE HEALTH INSURANCE | Primary: Nurse Practitioner

## 2018-10-14 NOTE — Progress Notes (Signed)
PT DAILY TREATMENT NOTE 10-18    Patient Name: Tim Peterson  Date:10/14/2018  DOB: 20-Jan-1971  [x]   Patient DOB Verified  Payor: AETNA / Plan: [facility] AET[employer] PPO / Product Type: PPO /    In time:10:34  Out time:11:07  Total Treatment Time (min): 33  Visit #: 4 of 8    Treatment Area: Unspecified rotator cuff tear or rupture of left shoulder, not specified as traumatic [M75.102]    SUBJECTIVE  Pain Level (0-10 scale): 5  Any medication changes, allergies to medications, adverse drug reactions, diagnosis change, or new procedure performed?: [x]  No    []  Yes (see summary sheet for update)  Subjective functional status/changes:   []  No changes reported  Pt reports that he was working this morning thus his shoulder is sore. He reports he needs to leave a little early today    OBJECTIVE    Modality rationale: PD to improve the patient's ability to    Min Type Additional Details    []  Estim:  [] Unatt       [] IFC  [] Premod                        [] Other:  [] w/ice   [] w/heat  Position:  Location:   5+3 [x]  Estim: [] Att    [] TENS instruct  [] NMES                    [x] Other:  combo [x] w/US   [] w/ice   [] w/heat  Position: seated  Location: left UT/supraspinatus    []   Traction: []  Cervical       [] Lumbar                       []  Prone          [] Supine                       [] Intermittent   [] Continuous Lbs:  []  before manual  []  after manual    []   Ultrasound: [] Continuous   []  Pulsed                           []   [] W/cm2:  Location:    []   Iontophoresis with dexamethasone         Location: []  Take home patch   []  In clinic    []   Ice     []   heat  []   Ice massage  []   Laser   []   Anodyne Position:  Location:    []   Laser with stim  []   Other:  Position:  Location:    []   Vasopneumatic Device Pressure:       []  lo []  med []  hi   Temperature: []  lo []  med []  hi   []  Skin assessment post-treatment:  [] intact [] redness- no adverse reaction    [] redness - adverse reaction:       9 min Therapeutic Exercise:  []  See flow  sheet :   Rationale: increase ROM and increase strength to improve the patient's ability to improve ease of daily tasks and work duties    8 min Neuromuscular Re-education:  []   See flow sheet : prone letters and Keiser D1    Rationale: increase strength, improve coordination and increase proprioception  to improve the patient's ability to utilize lower scapular musculature to reduce stress on  proximal scap and RTC    8 min Manual Therapy:  Left subscap TPR, left shoulder passive abduction and flexion with gentle distraction   Rationale: decrease pain and increase tissue extensibility to improve ease of self care            With   []  TE   []  TA   []  neuro   []  other: Patient Education: [x]  Review HEP    []  Progressed/Changed HEP based on:   []  positioning   []  body mechanics   []  transfers   []  heat/ice application    []  other:      Other Objective/Functional Measures:      Pain Level (0-10 scale) post treatment: 2    ASSESSMENT/Changes in Function: The pt reports that he just gets soreness when using the shoulder not necessarily sharp pain. He continues to demonstrate protracted and upper dominant scapular mechanics. Good response to therex with some challenge performing B D1 ext at [employer]. Continue with scapular and posterior cuff strength work    Patient will continue to benefit from skilled PT services to modify and progress therapeutic interventions, address functional mobility deficits, address ROM deficits, address strength deficits, analyze and address soft tissue restrictions, analyze and cue movement patterns and analyze and modify body mechanics/ergonomics to attain remaining goals.     []   See Plan of Care  []   See progress note/recertification  []   See Discharge Summary         Progress towards goals / Updated goals:  Short Term Goals:To be accomplished in 2weeks:  1. Pt will demonstrate I and compliance with HEP to maximize therapeutic effect.  IE: HEP issued              Current: reports  compliance 10/12/18  2. Pt will demonstrate left shoulder abduction AROM to 150 deg without pain increase.  IE: 130 deg with pain  Long Term Goals:To be accomplished in 4weeks:  1. Pt will demonstrate 5/5 left shoulder ER strength without pain to improve posterior cuff stability with work duties.  IE: 4+/5 with pain  2. Pt will report 75% improvement in sleeping ease to improve quality of life.  IE: 0%  3.The pt will demonstrate QP UE lifts without pain increase to improve ability to return to work duties.  IE: pain with WB'ing through left arm  4. Pt will improve FOTO score to 75 to demonstrate improved quality of life and function.  IE: 60    PLAN  []   Upgrade activities as tolerated     []   Continue plan of care  []   Update interventions per flow sheet       []   Discharge due to:_  []   Other:_      Stevan Born DPT, CMTPT 10/14/2018  11:09 AM    Future Appointments   Date Time Provider Department [facility]   10/20/2018  9:15 AM Laurence Compton, PTA [facility]   10/22/2018 10:30 AM Laurence Compton, PTA [facility]   10/26/2018  9:45 AM Stevan Born [facility]   10/28/2018  9:00 AM Laurence Compton, PTA [facility]   11/02/2018  9:45 AM Stevan Born [facility]   11/04/2018  9:15 AM Stevan Born [facility]

## 2018-10-14 NOTE — Progress Notes (Signed)
PT DAILY TREATMENT NOTE 10-18    Patient Name: [facility]  Date:10/14/2018  DOB: [birth year]  [x]   Patient DOB Verified  Payor: Holland Falling / Plan: [facility] [employer]ET[employer] PPO / Product Type: PPO /    In time:10:34  Out time:11:07  Total Treatment Time (min): 33  Visit #: 4 of 8    Treatment Area: Unspecified rotator cuff tear or rupture of left shoulder, not specified as traumatic [M75.102]    SUBJECTIVE  Pain Level (0-10 scale): 5  Any medication changes, allergies to medications, adverse drug reactions, diagnosis change, or new procedure performed?: [x]  No    []  Yes (see summary sheet for update)  Subjective functional status/changes:   []  No changes reported  Pt reports that he was working this morning thus his shoulder is sore. He reports he needs to leave a little early today    OBJECTIVE    Modality rationale: PD to improve the patient???s ability to    Min Type Additional Details    []  Estim:  [] Unatt       [] IFC  [] Premod                        [] Other:  [] w/ice   [] w/heat  Position:  Location:   5+3 [x]  Estim: [] Att    [] TENS instruct  [] NMES                    [x] Other:  combo [x] w/US   [] w/ice   [] w/heat  Position: seated  Location: left UT/supraspinatus    []   Traction: []  Cervical       [] Lumbar                       []  Prone          [] Supine                       [] Intermittent   [] Continuous Lbs:  []  before manual  []  after manual    []   Ultrasound: [] Continuous   []  Pulsed                           [] 1MHz   [] 3MHz W/cm2:  Location:    []   Iontophoresis with dexamethasone         Location: []  Take home patch   []  In clinic    []   Ice     []   heat  []   Ice massage  []   Laser   []   Anodyne Position:  Location:    []   Laser with stim  []   Other:  Position:  Location:    []   Vasopneumatic Device Pressure:       []  lo []  med []  hi   Temperature: []  lo []  med []  hi   []  Skin assessment post-treatment:  [] intact [] redness- no adverse reaction    [] redness ??? adverse reaction:        9 min Therapeutic Exercise:  []  See flow sheet :   Rationale: increase ROM and increase strength to improve the patient???s ability to improve ease of daily tasks and work duties    8 min Neuromuscular Re-education:  []   See flow sheet : prone letters and Keiser D1    Rationale: increase strength, improve coordination and increase proprioception  to improve the patient???s ability to utilize lower scapular musculature to reduce stress on  proximal scap and RTC    8 min Manual Therapy:  Left subscap TPR, left shoulder passive abduction and flexion with gentle distraction   Rationale: decrease pain and increase tissue extensibility to improve ease of self care            With   []  TE   []  TA   []  neuro   []  other: Patient Education: [x]  Review HEP    []  Progressed/Changed HEP based on:   []  positioning   []  body mechanics   []  transfers   []  heat/ice application    []  other:      Other Objective/Functional Measures:      Pain Level (0-10 scale) post treatment: 2    ASSESSMENT/Changes in Function: The pt reports that he just gets soreness when using the shoulder not necessarily sharp pain. He continues to demonstrate protracted and upper dominant scapular mechanics. Good response to therex with some challenge performing B D1 ext at [employer]. Continue with scapular and posterior cuff strength work    Patient will continue to benefit from skilled PT services to modify and progress therapeutic interventions, address functional mobility deficits, address ROM deficits, address strength deficits, analyze and address soft tissue restrictions, analyze and cue movement patterns and analyze and modify body mechanics/ergonomics to attain remaining goals.     []   See Plan of Care  []   See progress note/recertification  []   See Discharge Summary         Progress towards goals / Updated goals:  Short Term Goals:??To be accomplished in 2??weeks:  1. Pt will demonstrate I and compliance with HEP to maximize therapeutic effect.   ????????????????????????IE: HEP issued              Current: reports compliance 10/12/18  2. Pt will demonstrate left shoulder abduction AROM to 150 deg without pain increase.  ????????????????????????IE: 130 deg with pain  Long Term Goals:??To be accomplished in 4??weeks:  1. Pt will demonstrate 5/5 left shoulder ER strength without pain to improve posterior cuff stability with work duties.  ????????????????????????IE: 4+/5 with pain  2. Pt will report 75% improvement in sleeping ease to improve quality of life.  ????????????????????????IE: 0%  3.??The pt will demonstrate QP UE lifts without pain increase to improve ability to return to work duties.  ????????????????????????IE: pain with WB'ing through left arm  4. Pt will improve FOTO score to 75 to demonstrate improved quality of life and function.  ????????????????????????IE: 60    PLAN  []   Upgrade activities as tolerated     []   Continue plan of care  []   Update interventions per flow sheet       []   Discharge due to:_  []   Other:_      Stevan BornAndrew S Joyceline Maiorino DPT, CMTPT 10/14/2018  11:09 AM    Future Appointments   Date Time Provider Department Center   10/20/2018  9:15 AM Laurence Comptonatalano, Amanda K, PTA [facility]   10/22/2018 10:30 AM Laurence Comptonatalano, Amanda K, PTA [facility]   10/26/2018  9:45 AM Stevan BornAshby, Loki Wuthrich S [facility]   10/28/2018  9:00 AM Laurence Comptonatalano, Amanda K, PTA [facility]   11/02/2018  9:45 AM Stevan BornAshby, Johnavon Mcclafferty S [facility]   11/04/2018  9:15 AM Stevan BornAshby, Aniello Christopoulos S [facility]

## 2018-10-20 ENCOUNTER — Encounter: Payer: PRIVATE HEALTH INSURANCE | Primary: Nurse Practitioner

## 2018-10-22 ENCOUNTER — Encounter: Payer: PRIVATE HEALTH INSURANCE | Primary: Nurse Practitioner

## 2018-10-26 ENCOUNTER — Inpatient Hospital Stay: Admit: 2018-10-26 | Payer: PRIVATE HEALTH INSURANCE | Primary: Nurse Practitioner

## 2018-10-26 DIAGNOSIS — M75102 Unspecified rotator cuff tear or rupture of left shoulder, not specified as traumatic: Secondary | ICD-10-CM

## 2018-10-26 NOTE — Progress Notes (Signed)
PT DAILY TREATMENT NOTE 10-18    Patient Name: [facility]  Date:10/26/2018  DOB: [birth year]  [x]   Patient DOB Verified  Payor: AETNA / Plan: [facility] AET[employer] PPO / Product Type: PPO /    In time:9:45  Out time:10:24  Total Treatment Time (min): 39  Visit #: 5 of 8    Treatment Area: Unspecified rotator cuff tear or rupture of left shoulder, not specified as traumatic [M75.102]    SUBJECTIVE  Pain Level (0-10 scale): 6-7  Any medication changes, allergies to medications, adverse drug reactions, diagnosis change, or new procedure performed?: [x]  No    []  Yes (see summary sheet for update)  Subjective functional status/changes:   []  No changes reported  The pt reports that his shoulder is really hurting since being back to work.     OBJECTIVE    Modality rationale: decrease pain and increase tissue extensibility to improve the patient's ability to perform daily tasks with less shoulder pain   Min Type Additional Details    []  Estim:  [] Unatt       [] IFC  [] Premod                        [] Other:  [] w/ice   [] w/heat  Position:  Location:   6+2 [x]  Estim: [x] Att    [] TENS instruct  [] NMES                    [] Other:  [x] w/US   [] w/ice   [] w/heat  Position: right s/l  Location: left infraspinatus/teres    []   Traction: []  Cervical       [] Lumbar                       []  Prone          [] Supine                       [] Intermittent   [] Continuous Lbs:  []  before manual  []  after manual    []   Ultrasound: [] Continuous   []  Pulsed                           []   [] W/cm2:  Location:    []   Iontophoresis with dexamethasone         Location: []  Take home patch   []  In clinic    []   Ice     []   heat  []   Ice massage  []   Laser   []   Anodyne Position:  Location:    []   Laser with stim  []   Other:  Position:  Location:    []   Vasopneumatic Device Pressure:       []  lo []  med []  hi   Temperature: []  lo []  med []  hi   []  Skin assessment post-treatment:  [] intact [] redness- no adverse reaction    [] redness - adverse reaction:          15 min Therapeutic Exercise:  []  See flow sheet :   Rationale: increase ROM and increase strength to improve the patient's ability to improve ease of work performance    8 min Neuromuscular Re-education:  []   See flow sheet : scapular postioning and activation   Rationale: increase strength and improve coordination  to improve the patient's ability to improve posterior cuff and scapular stability    8  min Manual Therapy:  TPR left infraspinatus and left scap mobs in right s/l   Rationale: decrease pain, increase tissue extensibility and decrease trigger points to improve contractility of posterior cuff             With   []  TE   []  TA   []  neuro   []  other: Patient Education: [x]  Review HEP    []  Progressed/Changed HEP based on:   []  positioning   []  body mechanics   []  transfers   []  heat/ice application    []  other:      Other Objective/Functional Measures: pt hypersensitive to pressure through left rib cage area, not noted in s/l with scap mobs, pt makes verbal discomfort sounds with attempted scapular retractions    Pain Level (0-10 scale) post treatment: 3    ASSESSMENT/Changes in Function: The pt still demonstrates signs of RTC tendinitis at this time with increased infraspinatus irritation at this time. 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 He does report decreased pain post session. Continues to require cuing for reduced protracted shoulders and scap. Will continue to progress posterior shoulder stability as able.    Patient will continue to benefit from skilled PT services to modify and progress therapeutic interventions, address functional mobility deficits, address ROM deficits, address strength deficits, analyze and address soft tissue restrictions, analyze and cue movement patterns and analyze and modify body mechanics/ergonomics to attain remaining goals.     []   See Plan of Care  []   See progress note/recertification  []   See Discharge  Summary         Progress towards goals / Updated goals:  Short Term Goals:To be accomplished in 2weeks:  1. Pt will demonstrate I and compliance with HEP to maximize therapeutic effect.  IE: HEP issued  Current: reports compliance 10/12/18  2. Pt will demonstrate left shoulder abduction AROM to 150 deg without pain increase.  IE: 130 deg with pain   Current: WNL abduction AROM but pt reports pain > 90 deg today 10/26/2018  Long Term Goals:To be accomplished in 4weeks:  1. Pt will demonstrate 5/5 left shoulder ER strength without pain to improve posterior cuff stability with work duties.  IE: 4+/5 with pain  2. Pt will report 75% improvement in sleeping ease to improve quality of life.  IE: 0%  3.The pt will demonstrate QP UE lifts without pain increase to improve ability to return to work duties.  IE: pain with WB'ing through left arm  4. Pt will improve FOTO score to 75 to demonstrate improved quality of life and function.  IE: 60    PLAN  []   Upgrade activities as tolerated     []   Continue plan of care  []   Update interventions per flow sheet       []   Discharge due to:_  []   Other:_      Stevan Born DPT, CMTPT 10/26/2018  9:50 AM    Future Appointments   Date Time Provider Department Center   10/28/2018  9:00 AM Laurence Compton, PTA [facility]   11/02/2018  9:45 AM Stevan Born [facility]   11/04/2018  9:15 AM Stevan Born [facility]

## 2018-10-26 NOTE — Progress Notes (Signed)
PT DAILY TREATMENT NOTE 10-18    Patient Name: [facility]  Date:10/26/2018  DOB: 11/22/1970  [x]   Patient DOB Verified  Payor: AETNA / Plan: [facility] AETNA PPO / Product Type: PPO /    In time:9:45  Out time:10:24  Total Treatment Time (min): 39  Visit #: 5 of 8    Treatment Area: Unspecified rotator cuff tear or rupture of left shoulder, not specified as traumatic [M75.102]    SUBJECTIVE  Pain Level (0-10 scale): 6-7  Any medication changes, allergies to medications, adverse drug reactions, diagnosis change, or new procedure performed?: [x]  No    []  Yes (see summary sheet for update)  Subjective functional status/changes:   []  No changes reported  The pt reports that his shoulder is really hurting since being back to work.     OBJECTIVE    Modality rationale: decrease pain and increase tissue extensibility to improve the patient???s ability to perform daily tasks with less shoulder pain   Min Type Additional Details    []  Estim:  [] Unatt       [] IFC  [] Premod                        [] Other:  [] w/ice   [] w/heat  Position:  Location:   6+2 [x]  Estim: [x] Att    [] TENS instruct  [] NMES                    [] Other:  [x] w/US   [] w/ice   [] w/heat  Position: right s/l  Location: left infraspinatus/teres    []   Traction: []  Cervical       [] Lumbar                       []  Prone          [] Supine                       [] Intermittent   [] Continuous Lbs:  []  before manual  []  after manual    []   Ultrasound: [] Continuous   []  Pulsed                           [] 1MHz   [] 3MHz W/cm2:  Location:    []   Iontophoresis with dexamethasone         Location: []  Take home patch   []  In clinic    []   Ice     []   heat  []   Ice massage  []   Laser   []   Anodyne Position:  Location:    []   Laser with stim  []   Other:  Position:  Location:    []   Vasopneumatic Device Pressure:       []  lo []  med []  hi   Temperature: []  lo []  med []  hi   []  Skin assessment post-treatment:  [] intact [] redness- no adverse reaction    [] redness ??? adverse reaction:          15 min Therapeutic Exercise:  []  See flow sheet :   Rationale: increase ROM and increase strength to improve the patient???s ability to improve ease of work performance    8 min Neuromuscular Re-education:  []   See flow sheet : scapular postioning and activation   Rationale: increase strength and improve coordination  to improve the patient???s ability to improve posterior cuff and scapular stability    8  min Manual Therapy:  TPR left infraspinatus and left scap mobs in right s/l   Rationale: decrease pain, increase tissue extensibility and decrease trigger points to improve contractility of posterior cuff             With   []  TE   []  TA   []  neuro   []  other: Patient Education: [x]  Review HEP    []  Progressed/Changed HEP based on:   []  positioning   []  body mechanics   []  transfers   []  heat/ice application    []  other:      Other Objective/Functional Measures: pt hypersensitive to pressure through left rib cage area, not noted in s/l with scap mobs, pt makes verbal discomfort sounds with attempted scapular retractions    Pain Level (0-10 scale) post treatment: 3    ASSESSMENT/Changes in Function: The pt still demonstrates signs of RTC tendinitis at this time with increased infraspinatus irritation at this time. 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 He does report decreased pain post session. Continues to require cuing for reduced protracted shoulders and scap. Will continue to progress posterior shoulder stability as able.    Patient will continue to benefit from skilled PT services to modify and progress therapeutic interventions, address functional mobility deficits, address ROM deficits, address strength deficits, analyze and address soft tissue restrictions, analyze and cue movement patterns and analyze and modify body mechanics/ergonomics to attain remaining goals.     []   See Plan of Care  []   See progress note/recertification   []   See Discharge Summary         Progress towards goals / Updated goals:  Short Term Goals:??To be accomplished in 2??weeks:  1. Pt will demonstrate I and compliance with HEP to maximize therapeutic effect.  ????????????????????????IE: HEP issued  ????????????????????????Current: reports compliance 10/12/18  2. Pt will demonstrate left shoulder abduction AROM to 150 deg without pain increase.  ????????????????????????IE: 130 deg with pain   Current: WNL abduction AROM but pt reports pain > 90 deg today 10/26/2018  Long Term Goals:??To be accomplished in 4??weeks:  1. Pt will demonstrate 5/5 left shoulder ER strength without pain to improve posterior cuff stability with work duties.  ????????????????????????IE: 4+/5 with pain  2. Pt will report 75% improvement in sleeping ease to improve quality of life.  ????????????????????????IE: 0%  3.??The pt will demonstrate QP UE lifts without pain increase to improve ability to return to work duties.  ????????????????????????IE: pain with WB'ing through left arm  4. Pt will improve FOTO score to 75 to demonstrate improved quality of life and function.  ????????????????????????IE: 60    PLAN  []   Upgrade activities as tolerated     []   Continue plan of care  []   Update interventions per flow sheet       []   Discharge due to:_  []   Other:_      Mauricio Po DPT, CMTPT 10/26/2018  9:50 AM    Future Appointments   Date Time Provider [place]   10/28/2018  9:00 AM Hardie Shackleton, PTA [facility]   11/02/2018  9:45 AM Lance [place], [place]   11/04/2018  9:15 AM Mauricio Po [facility]

## 2018-10-28 ENCOUNTER — Inpatient Hospital Stay: Admit: 2018-10-28 | Payer: PRIVATE HEALTH INSURANCE | Primary: Nurse Practitioner

## 2018-10-28 NOTE — Progress Notes (Signed)
 [facility]  [address]  [place], [place] [address]  [phone]  [phone] fax    Physical Therapy Discharge Summary  Patient name: Tim Peterson Start of Care: 10/06/2018   Referral source: Mee Toribio DASEN, MD DOB: [birth year]                Medical Diagnosis: Unspecified rotator cuff tear or rupture of left shoulder, not specified as traumatic [M75.102]  Payor: AETNA / Plan: [facility] AETNA PPO / Product Type: PPO /  Onset Date:2 months                Treatment Diagnosis: Left shoulder pain   Prior Hospitalization: see medical history Provider#: [id number]   Medications: Verified on Patient summary List    Comorbidities: left shoulder dislocation (10 years)   Prior Level of Function: Pt RHD male independent with ADLs and work duties  Visits from Start of Care: 6    Missed Visits: 3  Reporting Period : 10/06/2018 to 10/28/2018      Summary of Care:  Short Term Goals:To be accomplished in 2weeks:  1. Pt will demonstrate I and compliance with HEP to maximize therapeutic effect.  IE: HEP issued  Current: reports compliance 10/12/18  2. Pt will demonstrate left shoulder abduction AROM to 150 deg without pain increase.  IE: 130 deg with pain  Current:WNL abduction AROM but pt reports pain > 90 deg today 10/26/2018  Long Term Goals:To be accomplished in 4weeks:  1. Pt will demonstrate 5/5 left shoulder ER strength without pain to improve posterior cuff stability with work duties.  IE: 4+/5 with pain  2. Pt will report 75% improvement in sleeping ease to improve quality of life.  IE: 0%  3.The pt will demonstrate QP UE lifts without pain increase to improve ability to return to work duties.  IE: pain with WB'ing through left arm  4. Pt will improve FOTO score to 75 to demonstrate improved quality of life and function.  IE: 60      ASSESSMENT/RECOMMENDATIONS: The pt reported  minimal progress with PT over 6 visits. He failed to show for remaining visit and thus will be D/C at this time. Unable to further assess goals.    [x] Discontinue therapy: [] Patient has reached or is progressing toward set goals      [x] Patient is non-compliant or has abdicated      [] Due to lack of appreciable progress towards set goals    Prentice GORMAN Santee DPT CMTPT 11/11/2018 11:56 AM

## 2018-10-28 NOTE — Progress Notes (Signed)
 PT DAILY TREATMENT NOTE 10-18    Patient Name: Tim Peterson  Date:10/28/2018  DOB: 10-Feb-1971  [x]   Patient DOB Verified  Payor: AETNA / Plan: [facility] AETNA PPO / Product Type: PPO /    In time:9:00  Out time:9:38  Total Treatment Time (min): 38  Visit #: 6 of 8    Treatment Area: Unspecified rotator cuff tear or rupture of left shoulder, not specified as traumatic [M75.102]    SUBJECTIVE  Pain Level (0-10 scale): 3/10  Any medication changes, allergies to medications, adverse drug reactions, diagnosis change, or new procedure performed?: [x]  No    []  Yes (see summary sheet for update)  Subjective functional status/changes:   []  No changes reported  I had a long but easy day at work so it's not that bad.    Pt reports at times he will wake up in the middle of the night because of the pain in his shoulder.      OBJECTIVE    Modality rationale: decrease inflammation and decrease pain to improve the patient's ability to tolerate ADLs.    Min Type Additional Details    []  Estim:  [] Unatt       [] IFC  [] Premod                        [] Other:  [] w/ice   [] w/heat  Position:  Location:   8 [x]  Estim: [x] Att    [] TENS instruct  [] NMES                    [] Other:  [x] w/US    [] w/ice   [] w/heat  Position:sitting  Location:left UT    []   Traction: []  Cervical       [] Lumbar                       []  Prone          [] Supine                       [] Intermittent   [] Continuous Lbs:  []  before manual  []  after manual    []   Ultrasound: [] Continuous   []  Pulsed                           []   [] W/cm2:  Location:    []   Iontophoresis with dexamethasone         Location: []  Take home patch   []  In clinic    []   Ice     []   heat  []   Ice massage  []   Laser   []   Anodyne Position:  Location:    []   Laser with stim  []   Other:  Position:  Location:    []   Vasopneumatic Device Pressure:       []  lo []  med []  hi   Temperature: []  lo []  med []  hi   []  Skin assessment post-treatment:  [] intact [] redness- no adverse reaction     [] redness - adverse reaction:     22 min Therapeutic Exercise:  [x]  See flow sheet :   Rationale: increase ROM and increase strength to improve the patient's ability to perform ADLs with increased ease.     8 min Manual Therapy:  TPR to left infraspinatus, UT and rhomboids; left Scap mobility with patient in sidelying.    Rationale: increase ROM and  increase tissue extensibility to improve functional mobility.         With   []  TE   []  TA   []  neuro   []  other: Patient Education: [x]  Review HEP    []  Progressed/Changed HEP based on:   []  positioning   []  body mechanics   []  transfers   []  heat/ice application    []  other:      Other Objective/Functional Measures: continued trigger points through left infraspinatus and UT.      Pain Level (0-10 scale) post treatment: 0/10    ASSESSMENT/Changes in Function: Pt continues to have significant TTP and trigger points through left shoulder girdle.     Patient will continue to benefit from skilled PT services to modify and progress therapeutic interventions, address functional mobility deficits, address ROM deficits, address strength deficits, analyze and address soft tissue restrictions, analyze and cue movement patterns and analyze and modify body mechanics/ergonomics to attain remaining goals.     []   See Plan of Care  []   See progress note/recertification  []   See Discharge Summary         Progress towards goals / Updated goals:  Short Term Goals:To be accomplished in 2weeks:  1. Pt will demonstrate I and compliance with HEP to maximize therapeutic effect.  IE: HEP issued  Current: reports compliance 10/12/18  2. Pt will demonstrate left shoulder abduction AROM to 150 deg without pain increase.  IE: 130 deg with pain              Current: WNL abduction AROM but pt reports pain > 90 deg today 10/26/2018  Long Term Goals:To be accomplished in 4weeks:  1. Pt will demonstrate 5/5 left shoulder ER strength without pain to improve  posterior cuff stability with work duties.  IE: 4+/5 with pain  2. Pt will report 75% improvement in sleeping ease to improve quality of life.  IE: 0%  3.The pt will demonstrate QP UE lifts without pain increase to improve ability to return to work duties.  IE: pain with WB'ing through left arm  4. Pt will improve FOTO score to 75 to demonstrate improved quality of life and function.  IE: 60    PLAN  []   Upgrade activities as tolerated     [x]   Continue plan of care  []   Update interventions per flow sheet       []   Discharge due to:_  []   Other:_      Alan MARLA Levels, PTA 10/28/2018  9:04 AM    Future Appointments   Date Time Provider Department Center   11/02/2018  9:45 AM Almyra Prentice RAMAN [facility]   11/04/2018  9:15 AM Almyra Prentice RAMAN [facility]

## 2018-10-28 NOTE — Progress Notes (Signed)
[facility] ??? [facility]  [place] [address]  [phone]  [phone] fax    Physical Therapy Discharge Summary  Patient name: Tim Peterson Start of Care: 10/06/2018   Referral source: Waynetta Pean, MD DOB: Sep 27, 1970                Medical Diagnosis: Unspecified rotator cuff tear or rupture of left shoulder, not specified as traumatic [M75.102]  Payor: AETNA / Plan: [facility] AETNA PPO / Product Type: PPO /  Onset Date:2 months                Treatment Diagnosis: Left shoulder pain   Prior Hospitalization: see medical history Provider#: [id number]   Medications: Verified on Patient summary List    Comorbidities: left shoulder dislocation (10 years)   Prior Level of Function: Pt RHD male independent with ADLs and work duties  Visits from Start of Care: 6    Missed Visits: 3  Reporting Period : 10/06/2018 to 10/28/2018      Summary of Care:  Short Term Goals:??To be accomplished in 2??weeks:  1. Pt will demonstrate I and compliance with HEP to maximize therapeutic effect.  ????????????????????????IE: HEP issued  ????????????????????????Current: reports compliance 10/12/18  2. Pt will demonstrate left shoulder abduction AROM to 150 deg without pain increase.  ????????????????????????IE: 130 deg with pain  ????????????????????????Current:??WNL abduction AROM but pt reports pain > 90 deg today 10/26/2018  Long Term Goals:??To be accomplished in 4??weeks:  1. Pt will demonstrate 5/5 left shoulder ER strength without pain to improve posterior cuff stability with work duties.  ????????????????????????IE: 4+/5 with pain  2. Pt will report 75% improvement in sleeping ease to improve quality of life.  ????????????????????????IE: 0%  3.??The pt will demonstrate QP UE lifts without pain increase to improve ability to return to work duties.  ????????????????????????IE: pain with WB'ing through left arm  4. Pt will improve FOTO score to 75 to demonstrate improved quality of life and function.  ????????????????????????IE: 60       ASSESSMENT/RECOMMENDATIONS: The pt reported minimal progress with PT over 6 visits. He failed to show for remaining visit and thus will be D/C at this time. Unable to further assess goals.    [x] Discontinue therapy: [] Patient has reached or is progressing toward set goals      [x] Patient is non-compliant or has abdicated      [] Due to lack of appreciable progress towards set Progress DPT CMTPT 11/11/2018 11:56 AM

## 2018-10-28 NOTE — Progress Notes (Signed)
PT DAILY TREATMENT NOTE 10-18    Patient Name: [facility]  Date:10/28/2018  DOB: 01-Dec-1970  [x]   Patient DOB Verified  Payor: AETNA / Plan: [facility] AETNA PPO / Product Type: PPO /    In time:9:00  Out time:9:38  Total Treatment Time (min): 38  Visit #: 6 of 8    Treatment Area: Unspecified rotator cuff tear or rupture of left shoulder, not specified as traumatic [M75.102]    SUBJECTIVE  Pain Level (0-10 scale): 3/10  Any medication changes, allergies to medications, adverse drug reactions, diagnosis change, or new procedure performed?: [x]  No    []  Yes (see summary sheet for update)  Subjective functional status/changes:   []  No changes reported  "I had a long but easy day at work so it's not that bad."    Pt reports at times he will wake up in the middle of the night because of the pain in his shoulder.      OBJECTIVE    Modality rationale: decrease inflammation and decrease pain to improve the patient???s ability to tolerate ADLs.    Min Type Additional Details    []  Estim:  [] Unatt       [] IFC  [] Premod                        [] Other:  [] w/ice   [] w/heat  Position:  Location:   8 [x]  Estim: [x] Att    [] TENS instruct  [] NMES                    [] Other:  [x] w/US   [] w/ice   [] w/heat  Position:sitting  Location:left UT    []   Traction: []  Cervical       [] Lumbar                       []  Prone          [] Supine                       [] Intermittent   [] Continuous Lbs:  []  before manual  []  after manual    []   Ultrasound: [] Continuous   []  Pulsed                           [] 1MHz   [] 3MHz W/cm2:  Location:    []   Iontophoresis with dexamethasone         Location: []  Take home patch   []  In clinic    []   Ice     []   heat  []   Ice massage  []   Laser   []   Anodyne Position:  Location:    []   Laser with stim  []   Other:  Position:  Location:    []   Vasopneumatic Device Pressure:       []  lo []  med []  hi   Temperature: []  lo []  med []  hi   []  Skin assessment post-treatment:  [] intact [] redness- no adverse reaction     [] redness ??? adverse reaction:     22 min Therapeutic Exercise:  [x]  See flow sheet :   Rationale: increase ROM and increase strength to improve the patient???s ability to perform ADLs with increased ease.     8 min Manual Therapy:  TPR to left infraspinatus, UT and rhomboids; left Scap mobility with patient in sidelying.    Rationale: increase ROM and  increase tissue extensibility to improve functional mobility.         With   []  TE   []  TA   []  neuro   []  other: Patient Education: [x]  Review HEP    []  Progressed/Changed HEP based on:   []  positioning   []  body mechanics   []  transfers   []  heat/ice application    []  other:      Other Objective/Functional Measures: continued trigger points through left infraspinatus and UT.      Pain Level (0-10 scale) post treatment: 0/10    ASSESSMENT/Changes in Function: Pt continues to have significant TTP and trigger points through left shoulder girdle.     Patient will continue to benefit from skilled PT services to modify and progress therapeutic interventions, address functional mobility deficits, address ROM deficits, address strength deficits, analyze and address soft tissue restrictions, analyze and cue movement patterns and analyze and modify body mechanics/ergonomics to attain remaining goals.     []   See Plan of Care  []   See progress note/recertification  []   See Discharge Summary         Progress towards goals / Updated goals:  Short Term Goals:??To be accomplished in 2??weeks:  1. Pt will demonstrate I and compliance with HEP to maximize therapeutic effect.  ????????????????????????IE: HEP issued  ????????????????????????Current: reports compliance 10/12/18  2. Pt will demonstrate left shoulder abduction AROM to 150 deg without pain increase.  ????????????????????????IE: 130 deg with pain              Current: WNL abduction AROM but pt reports pain > 90 deg today 10/26/2018  Long Term Goals:??To be accomplished in 4??weeks:  1. Pt will demonstrate 5/5 left shoulder ER strength without pain to  improve posterior cuff stability with work duties.  ????????????????????????IE: 4+/5 with pain  2. Pt will report 75% improvement in sleeping ease to improve quality of life.  ????????????????????????IE: 0%  3.??The pt will demonstrate QP UE lifts without pain increase to improve ability to return to work duties.  ????????????????????????IE: pain with WB'ing through left arm  4. Pt will improve FOTO score to 75 to demonstrate improved quality of life and function.  ????????????????????????IE: 60    PLAN  []   Upgrade activities as tolerated     [x]   Continue plan of care  []   Update interventions per flow sheet       []   Discharge due to:_  []   Other:_      Laurence ComptonAmanda K Scottie Stanish, PTA 10/28/2018  9:04 AM    Future Appointments   Date Time Provider Department Center   11/02/2018  9:45 AM Stevan BornAshby, Andrew S [facility]   11/04/2018  9:15 AM Stevan BornAshby, Andrew S [facility]

## 2018-11-02 ENCOUNTER — Inpatient Hospital Stay: Payer: PRIVATE HEALTH INSURANCE | Primary: Nurse Practitioner

## 2018-11-03 ENCOUNTER — Encounter: Payer: PRIVATE HEALTH INSURANCE | Primary: Nurse Practitioner

## 2018-11-04 ENCOUNTER — Encounter: Payer: PRIVATE HEALTH INSURANCE | Primary: Nurse Practitioner

## 2018-11-24 ENCOUNTER — Ambulatory Visit
Admit: 2018-11-24 | Discharge: 2018-11-24 | Payer: PRIVATE HEALTH INSURANCE | Attending: Orthopaedic Surgery | Primary: Nurse Practitioner

## 2018-11-24 ENCOUNTER — Ambulatory Visit: Attending: Orthopaedic Surgery | Primary: Nurse Practitioner

## 2018-11-24 DIAGNOSIS — M75102 Unspecified rotator cuff tear or rupture of left shoulder, not specified as traumatic: Secondary | ICD-10-CM

## 2018-11-24 MED ORDER — TRIAMCINOLONE ACETONIDE 40 MG/ML SUSP FOR INJECTION
40 mg/mL | Freq: Once | INTRAMUSCULAR | 0 refills | Status: AC
Start: 2018-11-24 — End: 2018-11-24

## 2018-11-24 NOTE — Progress Notes (Signed)
Progress Notes by Waynetta Pean, MD at 11/24/18 1040                Author: Waynetta Pean, MD  Service: --  Author Type: Physician       Filed: 11/24/18 1126  Encounter Date: 11/24/2018  Status: Signed          Editor: Waynetta Pean, MD (Physician)                             Patient: Tim Peterson                MRN:  [id number]       SSN: [id number]   Date of Birth: 1970/10/07         AGE: 48 y.o.        SEX:  male   Body mass index is 21.11 kg/m??.      PCP: None   11/24/18      Chief Complaint: Left shoulder pain      HPI: Tim Peterson is a  48 y.o. male patient who returns to the office today for left shoulder pain.  He rates his  pain as 6 out of 10 today.  He has been in physical therapy and reports that he has not had any improvement in his left shoulder symptoms.  It continues to bother him when he reaches above head.        Past Medical History:        Diagnosis  Date         ?  Elevated blood pressure reading       ?  Hypertension       ?  Kidney stones       ?  Pain in joint of right shoulder           ?  TB (tuberculosis), treated             No family history on file.        Current Outpatient Medications          Medication  Sig  Dispense  Refill           ?  triamcinolone acetonide (KENALOG-40) 40 mg/mL injection  1 mL by Intra artICUlar route once for 1 dose.  1 Vial  0     ?  diclofenac EC (VOLTAREN) 50 mg EC tablet  Take 1 Tab by mouth two (2) times daily (with meals).  60 Tab  1           ?  naproxen (NAPROSYN) 375 mg tablet  Take 1 Tab by mouth two (2) times daily (with meals).  30 Tab  0           No Known Allergies        Past Surgical History:         Procedure  Laterality  Date          ?  HX ORTHOPAEDIC              left leg pins             Social History          Socioeconomic History         ?  Marital status:  SINGLE              Spouse name:  Not on file         ?  Number of children:  Not on file     ?  Years of education:  Not on file     ?  Highest education  level:  Not on file       Occupational History        ?  Not on file       Social Needs         ?  Financial resource strain:  Not on file        ?  Food insecurity              Worry:  Not on file         Inability:  Not on file        ?  Transportation needs              Medical:  Not on file         Non-medical:  Not on file       Tobacco Use         ?  Smoking status:  Never Smoker     ?  Smokeless tobacco:  Never Used       Substance and Sexual Activity         ?  Alcohol use:  Yes             Comment: occasional         ?  Drug use:  Yes              Types:  Marijuana             Comment: socially         ?  Sexual activity:  Not on file       Lifestyle        ?  Physical activity              Days per week:  Not on file         Minutes per session:  Not on file         ?  Stress:  Not on file       Relationships        ?  Social Engineer, manufacturing systemsconnections              Talks on phone:  Not on file         Gets together:  Not on file         Attends religious service:  Not on file         Active member of club or organization:  Not on file         Attends meetings of clubs or organizations:  Not on file         Relationship status:  Not on file        ?  Intimate partner violence              Fear of current or ex partner:  Not on file         Emotionally abused:  Not on file         Physically abused:  Not on file         Forced sexual activity:  Not on file        Other Topics  Concern        ?  Not on file       Social History Narrative          **  Merged History Encounter **                      REVIEW OF SYSTEMS:        No changes from previous review of systems unless noted.      PHYSICAL EXAMINATION:   Visit Vitals      BP  (!) 130/94     Pulse  (!) 59     Temp  96.9 ??F (36.1 ??C) (Temporal)     Resp  16     Ht  6\' 1"  (1.854 m)     Wt  160 lb (72.6 kg)     SpO2  100%        BMI  21.11 kg/m??        Body mass index is 21.11 kg/m??.   GENERAL: Alert and oriented x3, in no acute distress.   HEENT: Normocephalic, atraumatic.      RESP: Non labored breathing.   SKIN: No rashes or lesions noted.    Shoulder Examination      R   L   ROM    FF  Full   Full   ER  Full   Full    IR  Full   Full   Rotator Cuff Pain    Supra  -   +    Infra  -   -    Subscap -   -   Crepitus  -   -   Effusion  -   -   Warmth  -   -    Erythema  -   -   Instability  -   -   AC Joint TTP  -   -   Clavicle    Deformity -   -    TTP  -   -   Proximal Humerus    Deformity -   -    TTP  -   -   Deltoid Strength 5   5   Biceps Strength 5   5   Biceps Deformity -   -   Biceps Groove Pain -   -   Impingement Sign -   -          IMAGING:   No new imaging today      ASSESSMENT & PLAN   Diagnosis: Left shoulder rotator cuff pain      Bostyn continues to have symptomatic left shoulder rotator cuff pain.  He does not have any full-thickness tears.  I do not recommend any surgery.  We will continue to treat this conservatively with a corticosteroid injection today.  Follow-up as needed.      [facility]   OFFICE PROCEDURE PROGRESS NOTE            Chart reviewed for the following:    I, Jim Like, MD, have reviewed the History, Physical and updated the Allergic reactions for [facility]       TIME OUT performed immediately prior to start of procedure:    I, Jim Like, MD, have performed the following reviews on [facility] prior to the start of the procedure:              * Patient was identified by name and date of birth    * Agreement on procedure being performed was verified   * Risks and Benefits explained to the patient   * Procedure site verified and  marked as necessary   * Patient was positioned for comfort   * Consent was signed and verified      Time: 11:26 AM      Location: Left shoulder      Kenalog 40mg  & 3cc Lidocaine      Date of procedure: 11/24/2018      Procedure performed by:  Jim Likeaniel T Corrion Stirewalt, MD      Provider assisted by: Ardell Isaacsassandra Murray LPN      Patient assisted by: self      How tolerated by patient: tolerated the  procedure well with no complications      Post Procedural Pain Scale: 0 - No Hurt      Comments: none                           Electronically signed by: Jim Likeaniel T Mikyla Schachter, MD

## 2018-11-24 NOTE — Progress Notes (Signed)
Patient: Tim Peterson                MRN: [id number]       SSN: [id number]  Date of Birth: 1970-04-19        AGE: 48 y.o.        SEX: male  Body mass index is 21.11 kg/m??.    PCP: None  11/24/18    Chief Complaint: Left shoulder pain    HPI: Tim Peterson is a 48 y.o. male patient who returns to the office today for left shoulder pain.  He rates his pain as 6 out of 10 today.  He has been in physical therapy and reports that he has not had any improvement in his left shoulder symptoms.  It continues to bother him when he reaches above head.    Past Medical History:   Diagnosis Date   ??? Elevated blood pressure reading    ??? Hypertension    ??? Kidney stones    ??? Pain in joint of right shoulder    ??? TB (tuberculosis), treated        No family history on file.    Current Outpatient Medications   Medication Sig Dispense Refill   ??? triamcinolone acetonide (KENALOG-40) 40 mg/mL injection 1 mL by Intra artICUlar route once for 1 dose. 1 Vial 0   ??? diclofenac EC (VOLTAREN) 50 mg EC tablet Take 1 Tab by mouth two (2) times daily (with meals). 60 Tab 1   ??? naproxen (NAPROSYN) 375 mg tablet Take 1 Tab by mouth two (2) times daily (with meals). 30 Tab 0       No Known Allergies    Past Surgical History:   Procedure Laterality Date   ??? HX ORTHOPAEDIC      left leg pins       Social History     Socioeconomic History   ??? Marital status: SINGLE     Spouse name: Not on file   ??? Number of children: Not on file   ??? Years of education: Not on file   ??? Highest education level: Not on file   Occupational History   ??? Not on file   Social Needs   ??? Financial resource strain: Not on file   ??? Food insecurity     Worry: Not on file     Inability: Not on file   ??? Transportation needs     Medical: Not on file     Non-medical: Not on file   Tobacco Use   ??? Smoking status: Never Smoker   ??? Smokeless tobacco: Never Used   Substance and Sexual Activity   ??? Alcohol use: Yes     Comment: occasional   ??? Drug use: Yes     Types: Marijuana      Comment: socially   ??? Sexual activity: Not on file   Lifestyle   ??? Physical activity     Days per week: Not on file     Minutes per session: Not on file   ??? Stress: Not on file   Relationships   ??? Social Wellsite geologist on phone: Not on file     Gets together: Not on file     Attends religious service: Not on file     Active member of club or organization: Not on file     Attends meetings of clubs or organizations: Not on file     Relationship status: Not on file   ???  Intimate partner violence     Fear of current or ex partner: Not on file     Emotionally abused: Not on file     Physically abused: Not on file     Forced sexual activity: Not on file   Other Topics Concern   ??? Not on file   Social History Narrative    ** Merged History Encounter **            REVIEW OF SYSTEMS:      No changes from previous review of systems unless noted.    PHYSICAL EXAMINATION:  Visit Vitals  BP (!) 130/94   Pulse (!) 59   Temp 96.9 ??F (36.1 ??C) (Temporal)   Resp 16   Ht 6\' 1"  (1.854 m)   Wt 160 lb (72.6 kg)   SpO2 100%   BMI 21.11 kg/m??     Body mass index is 21.11 kg/m??.  GENERAL: Alert and oriented x3, in no acute distress.  HEENT: Normocephalic, atraumatic.    RESP: Non labored breathing.  SKIN: No rashes or lesions noted.   Shoulder Examination     R   L  ROM   FF  Full   Full  ER  Full   Full   IR  Full   Full  Rotator Cuff Pain   Supra  -   +   Infra  -   -   Subscap -   -  Crepitus  -   -  Effusion  -   -  Warmth  -   -   Erythema  -   -  Instability  -   -  AC Joint TTP  -   -  Clavicle   Deformity -   -   TTP  -   -  Proximal Humerus   Deformity -   -   TTP  -   -  Deltoid Strength 5   5  Biceps Strength 5   5  Biceps Deformity -   -  Biceps Groove Pain -   -  Impingement Sign -   -       IMAGING:  No new imaging today    ASSESSMENT & PLAN  Diagnosis: Left shoulder rotator cuff pain    Javin continues to have symptomatic left shoulder rotator cuff pain.  He  does not have any full-thickness tears.  I do not recommend any surgery.  We will continue to treat this conservatively with a corticosteroid injection today.  Follow-up as needed.    [facility]  OFFICE PROCEDURE PROGRESS NOTE        Chart reviewed for the following:   I, Waynetta Pean, MD, have reviewed the History, Physical and updated the Allergic reactions for [place] performed immediately prior to start of procedure:   I, Waynetta Pean, MD, have performed the following reviews on [facility] prior to the start of the procedure:            * Patient was identified by name and date of birth   * Agreement on procedure being performed was verified  * Risks and Benefits explained to the patient  * Procedure site verified and marked as necessary  * Patient was positioned for comfort  * Consent was signed and verified    Time: 11:26 AM    Location: Left shoulder    Kenalog 40mg  & 3cc Lidocaine  Date of procedure: 11/24/2018    Procedure performed by:  Jim Likeaniel T Vinh Sachs, MD    Provider assisted by: Ardell Isaacsassandra Murray LPN    Patient assisted by: self    How tolerated by patient: tolerated the procedure well with no complications    Post Procedural Pain Scale: 0 - No Hurt    Comments: none                  Electronically signed by: Jim Likeaniel T Angelice Piech, MD

## 2019-10-01 ENCOUNTER — Encounter: Attending: Orthopaedic Surgery | Primary: Nurse Practitioner

## 2020-02-24 ENCOUNTER — Ambulatory Visit
Admit: 2020-02-24 | Discharge: 2020-02-24 | Payer: PRIVATE HEALTH INSURANCE | Attending: Nurse Practitioner | Primary: Nurse Practitioner

## 2020-02-24 ENCOUNTER — Ambulatory Visit: Attending: Nurse Practitioner | Primary: Nurse Practitioner

## 2020-02-24 DIAGNOSIS — Z7689 Persons encountering health services in other specified circumstances: Secondary | ICD-10-CM

## 2020-02-24 NOTE — Progress Notes (Signed)
 Chief Complaint   Patient presents with   . Establish Care         1. Have you been to the ER, urgent care clinic since your last visit?  Hospitalized since your last visit? n/a    2. Have you seen or consulted any other health care providers outside of the [facility] since your last visit? n/a    3. For patients aged 47-75: Has the patient had a colonoscopy? no

## 2020-02-24 NOTE — Progress Notes (Signed)
Progress Notes by Verdon Cummins, DNP at 02/24/20 1030                Author: Verdon Cummins, [place]  Service: --  Author Type: Nurse Practitioner       Filed: 02/25/20 1645  Encounter Date: 02/24/2020  Status: Signed          Editor: Verdon Cummins, DNP (Nurse Practitioner)               [facility]   [name]   [address]   [place], [place] [address]   [phone] fax      02/24/2020      HPI:    Tim Peterson Oct 30, 1970 is  a pleasant BLACK/AFR[place] AMER[place] male  who presents today to establish care and for routine physical exam.  He has not seen a provider in over 10 years unless he went to an urgent care or the ER.      Today's concerns:   1.  Weight loss.  Has lost 10 pounds over the past year.  He has decreased the amount of food he is consuming as he is only eating once a day during the workweek.  Skipped breakfast  and lunch but snack nuts until dinner where he will over consume food as he is "starving."  On the weekends he will eat breakfast as he is not working.  He denies abdominal pain, diarrhea, nausea, vomiting, GU issues.      2.  Developed soft lumps on his right thigh and his stomach.  He was evaluated by an ER provider who told him "not to worry about it."  He is curious as to what these are.      3.  Recently lost maternal uncle to prostate cancer.  He is seeking screening.      HM: He has never received influenza vaccine.  He completed his Covid vaccines.  He has not had a colonoscopy.        Past Medical History:        Diagnosis  Date         ?  Elevated blood pressure reading       ?  Hypertension       ?  Kidney stones       ?  Marijuana smoker  02/24/2020     ?  Multiple lipomas  02/24/2020     ?  Pain in joint of right shoulder       ?  Rotator cuff syndrome of left shoulder  2020          Dr Guido Sander         ?  TB (tuberculosis), treated           ?  Weight loss  02/24/2020          Past Surgical History:         Procedure   Laterality  Date          ?  HX ORTHOPAEDIC              left leg pins          No current outpatient medications on file.          No current facility-administered medications for this visit.        Allergies and Intolerances:    No Known Allergies   Family History:      Family History  Problem  Relation  Age of Onset          ?  Hypertension  Mother          Social History:    He  reports that he has never smoked. He has never used smokeless  tobacco.      Social History          Substance and Sexual Activity        Alcohol Use  Yes          Comment: occasionally        Immunization History:     Immunization History        Administered  Date(s) Administered         ?  COVID-19, Moderna, Primary or Immunocompromised Series, MRNA, PF, 17800mcg/0.5mL  10/21/2019, 11/18/2019           Review of Systems:    As above included in HPI.   Otherwise 11 point review of systems negative including constitutional, skin, HENT, eyes, respiratory, cardiovascular, gastrointestinal, genitourinary, musculoskeletal, endocrine, hematologic, allergy, and neurologic.         Physical:    Visit Vitals      BP  130/88     Pulse  67     Temp  97.8 ??F (36.6 ??C) (Temporal)     Resp  16     Ht  6\' 1"  (1.854 m)     Wt  160 lb 3.2 oz (72.7 kg)     SpO2  100%        BMI  21.14 kg/m??           Wt Readings from Last 3 Encounters:        02/24/20  160 lb 3.2 oz (72.7 kg)     11/24/18  160 lb (72.6 kg)        09/25/18  158 lb 12.8 oz (72 kg)              Exam:    Physical Exam   Vitals and nursing note reviewed.   Constitutional:        Appearance: Normal appearance.      Comments: Thin build   HENT:       Head: Normocephalic and atraumatic.      Right Ear: Tympanic membrane, ear canal and external ear normal.      Left Ear: Tympanic membrane, ear canal and external ear normal.   Eyes:       Extraocular Movements: Extraocular movements intact.      Conjunctiva/sclera: Conjunctivae normal.    Neck:       Vascular: No carotid bruit.    Cardiovascular :       Rate and Rhythm: Normal rate and regular rhythm.      Pulses: Normal pulses.      Heart sounds: Normal heart sounds.      Comments: No edema noted  Pulmonary:       Effort: Pulmonary effort is normal. No respiratory distress.      Breath sounds: Normal breath sounds. No wheezing.    Abdominal:      General: Abdomen is flat. Bowel sounds are normal. There is no distension.      Palpations: Abdomen is soft.      Tenderness: There is no abdominal tenderness.     Musculoskeletal:          General: Normal range of motion.      Cervical back: Normal range of motion and  neck supple.      Comments: Gait stable    Skin:      General: Skin is warm and dry.               Comments: Areas of lipoma.   Neurological:       General: No focal deficit present.      Mental Status: He is alert and oriented to person, place, and time.    Psychiatric:         Mood and Affect: Mood normal.         Behavior: Behavior normal.         Thought Content: Thought content normal.         Judgment: Judgment normal.          Review of Data:   Labs reviewed: N/A   Will obtain today         Plan:             ICD-10-CM  ICD-9-CM             1.  Encounter to establish care   Z76.89  V65.8       2.  Weight loss   R63.4  783.21  METABOLIC PANEL, COMPREHENSIVE                TSH 3RD GENERATION           CBC WITH AUTOMATED DIFF           PSA, DIAGNOSTIC (PROSTATE SPECIFIC AG)           3.  Multiple lipomas   D17.9  214.9       4.  Routine general medical examination at a health care facility   Z00.00  V70.0  LIPID PANEL                PSA, DIAGNOSTIC (PROSTATE SPECIFIC AG)           5.  Colon cancer screening   Z12.11  V76.51  REFERRAL TO GASTROENTEROLOGY     6.  Family history of prostate cancer   Z80.42  V16.42  PSA, DIAGNOSTIC (PROSTATE SPECIFIC AG)           7.  Marijuana smoker   F12.90  305.20          -Encounter to establish care   Patient is transferring to me from their previous provider. I spent no less than 20 minutes  reviewing and updating the chart to include previous labs, diagnostics, and specialty notes.      -Weight loss   Most likely due to decrease in food consumption.   Strongly encourage patient to consume 3 meals a day.   Differential diagnosis:   Lack of food consumption   GI complications-less likely   Cancer-less likely   Will obtain labs today       -Multiple lipomas   No intervention needed at this time      -Colon cancer screening   Patient hesitant.  Much discussion was had and patient agrees to have procedure completed.   Referral placed      -Family history of prostate cancer   Maternal uncle recently passed of prostate cancer   Obtain PSA today per patient request      -Marijuana smoker      Follow up 12 months for physical    Labs needed 1 week prior to appt: ?  Need to review results from today's labs when available  Dr. Essie Christine, AGNP-C, DNP   [facility]       The total face time was 45 minutes. Greater than 50% of that time was spent in counseling and/or coordination of care. My summary of patient counseling and coordination of care includes Reviewing medical record, assessing patient, placing orders, and  discussing plan of care with patient.

## 2020-02-24 NOTE — Progress Notes (Signed)
Pls inform pt all labs wnl. Thank you

## 2020-02-25 LAB — COMPREHENSIVE METABOLIC PANEL
ALT: 14 IU/L (ref 0–44)
AST: 25 IU/L (ref 0–40)
Albumin/Globulin Ratio: 2 NA (ref 1.2–2.2)
Albumin: 4.5 g/dL (ref 4.0–5.0)
Alkaline Phosphatase: 60 IU/L (ref 44–121)
BUN: 13 mg/dL (ref 6–24)
Bun/Cre Ratio: 12 NA (ref 9–20)
CO2: 23 mmol/L (ref 20–29)
Calcium: 9.6 mg/dL (ref 8.7–10.2)
Chloride: 105 mmol/L (ref 96–106)
Creatinine: 1.05 mg/dL (ref 0.76–1.27)
EGFR IF NonAfrican American: 84 mL/min/{1.73_m2} (ref >59)
GFR African American: 97 mL/min/{1.73_m2} (ref >59)
Globulin, Total: 2.2 g/dL (ref 1.5–4.5)
Glucose: 96 mg/dL (ref 65–99)
Potassium: 3.9 mmol/L (ref 3.5–5.2)
Sodium: 142 mmol/L (ref 134–144)
Total Bilirubin: 0.5 mg/dL (ref 0.0–1.2)
Total Protein: 6.7 g/dL (ref 6.0–8.5)

## 2020-02-25 LAB — CBC WITH AUTO DIFFERENTIAL
Basophils %: 1 %
Basophils Absolute: 0.1 10*3/uL (ref 0.0–0.2)
Eosinophils %: 3 %
Eosinophils Absolute: 0.2 10*3/uL (ref 0.0–0.4)
Granulocyte Absolute Count: 0 10*3/uL (ref 0.0–0.1)
Hematocrit: 37.5 % (ref 37.5–51.0)
Hemoglobin: 13.4 g/dL (ref 13.0–17.7)
Immature Granulocytes: 0 %
Lymphocytes %: 34 %
Lymphocytes Absolute: 2 10*3/uL (ref 0.7–3.1)
MCH: 33 pg (ref 26.6–33.0)
MCHC: 35.7 g/dL (ref 31.5–35.7)
MCV: 92 fL (ref 79–97)
Monocytes %: 7 %
Monocytes Absolute: 0.4 10*3/uL (ref 0.1–0.9)
Neutrophils %: 55 %
Neutrophils Absolute: 3.1 10*3/uL (ref 1.4–7.0)
Platelets: 256 10*3/uL (ref 150–450)
RBC: 4.06 x10E6/uL — ABNORMAL LOW (ref 4.14–5.80)
RDW: 12.7 % (ref 11.6–15.4)
WBC: 5.7 10*3/uL (ref 3.4–10.8)

## 2020-02-25 LAB — LIPID PANEL
Cholesterol, Total: 159 mg/dL (ref 100–199)
Cholesterol, total: 159 mg/dL (ref 100–199)
HDL Cholesterol: 72 mg/dL (ref >39)
HDL: 72 mg/dL (ref >39)
LDL Calculated: 78 mg/dL (ref 0–99)
LDL, calculated: 78 mg/dL (ref 0–99)
Triglyceride: 42 mg/dL (ref 0–149)
Triglycerides: 42 mg/dL (ref 0–149)
VLDL, calculated: 9 mg/dL (ref 5–40)
VLDL: 9 mg/dL (ref 5–40)

## 2020-02-25 LAB — TSH 3RD GENERATION
TSH: 0.75 u[IU]/mL (ref 0.450–4.500)
TSH: 0.75 u[IU]/mL (ref 0.450–4.500)

## 2020-02-25 LAB — CVD REPORT

## 2020-02-25 LAB — PSA PROSTATIC SPECIFIC ANTIGEN: PSA: 0.6 ng/mL (ref 0.0–4.0)

## 2020-02-25 LAB — METABOLIC PANEL, COMPREHENSIVE
A-G Ratio: 2 (ref 1.2–2.2)
ALT (SGPT): 14 IU/L (ref 0–44)
AST (SGOT): 25 IU/L (ref 0–40)
Albumin: 4.5 g/dL (ref 4.0–5.0)
Alk. phosphatase: 60 IU/L (ref 44–121)
BUN/Creatinine ratio: 12 (ref 9–20)
BUN: 13 mg/dL (ref 6–24)
Bilirubin, total: 0.5 mg/dL (ref 0.0–1.2)
CO2: 23 mmol/L (ref 20–29)
Calcium: 9.6 mg/dL (ref 8.7–10.2)
Chloride: 105 mmol/L (ref 96–106)
Creatinine: 1.05 mg/dL (ref 0.76–1.27)
GFR est AA: 97 mL/min/{1.73_m2} (ref >59)
GFR est non-AA: 84 mL/min/{1.73_m2} (ref >59)
GLOBULIN, TOTAL: 2.2 g/dL (ref 1.5–4.5)
Glucose: 96 mg/dL (ref 65–99)
Potassium: 3.9 mmol/L (ref 3.5–5.2)
Protein, total: 6.7 g/dL (ref 6.0–8.5)
Sodium: 142 mmol/L (ref 134–144)

## 2020-02-25 LAB — CBC WITH AUTOMATED DIFF
ABS. BASOPHILS: 0.1 10*3/uL (ref 0.0–0.2)
ABS. EOSINOPHILS: 0.2 10*3/uL (ref 0.0–0.4)
ABS. IMM. GRANS.: 0 10*3/uL (ref 0.0–0.1)
ABS. MONOCYTES: 0.4 10*3/uL (ref 0.1–0.9)
ABS. NEUTROPHILS: 3.1 10*3/uL (ref 1.4–7.0)
Abs Lymphocytes: 2 10*3/uL (ref 0.7–3.1)
BASOPHILS: 1 %
EOSINOPHILS: 3 %
HCT: 37.5 % (ref 37.5–51.0)
HGB: 13.4 g/dL (ref 13.0–17.7)
IMMATURE GRANULOCYTES: 0 %
Lymphocytes: 34 %
MCH: 33 pg (ref 26.6–33.0)
MCHC: 35.7 g/dL (ref 31.5–35.7)
MCV: 92 fL (ref 79–97)
MONOCYTES: 7 %
NEUTROPHILS: 55 %
PLATELET: 256 10*3/uL (ref 150–450)
RBC: 4.06 x10E6/uL — ABNORMAL LOW (ref 4.14–5.80)
RDW: 12.7 % (ref 11.6–15.4)
WBC: 5.7 10*3/uL (ref 3.4–10.8)

## 2020-02-25 LAB — PSA, DIAGNOSTIC (PROSTATE SPECIFIC AG): Prostate Specific Ag: 0.6 ng/mL (ref 0.0–4.0)

## 2020-03-01 NOTE — Telephone Encounter (Signed)
Unable to reach patient or LVM due to mailbox not being set up. Will try reaching patient at a later time.

## 2020-03-01 NOTE — Telephone Encounter (Signed)
-----   Message from Verdon Cummins, [place] sent at 02/29/2020 10:11 PM EST -----  Pls inform pt all labs wnl. Thank you

## 2020-03-02 NOTE — Telephone Encounter (Signed)
Pt aware all labs are normal

## 2020-03-02 NOTE — Telephone Encounter (Signed)
Unable to reach patient or LVM due to mailbox not being set up.

## 2020-09-13 ENCOUNTER — Inpatient Hospital Stay: Admit: 2020-09-13 | Payer: BLUE CROSS/BLUE SHIELD | Primary: Nurse Practitioner

## 2020-09-13 ENCOUNTER — Ambulatory Visit
Admit: 2020-09-13 | Discharge: 2020-09-13 | Payer: BLUE CROSS/BLUE SHIELD | Attending: Internal Medicine | Primary: Nurse Practitioner

## 2020-09-13 ENCOUNTER — Ambulatory Visit: Attending: Internal Medicine | Primary: Nurse Practitioner

## 2020-09-13 DIAGNOSIS — R319 Hematuria, unspecified: Secondary | ICD-10-CM

## 2020-09-13 LAB — AMB POC URINALYSIS DIP STICK AUTO W/O MICRO
Bilirubin (UA POC): NEGATIVE
Bilirubin, Urine, POC: NEGATIVE
Glucose (UA POC): NEGATIVE
Glucose, Urine, POC: NEGATIVE
Ketones (UA POC): NEGATIVE
Ketones, Urine, POC: NEGATIVE
Leukocyte Esterase, Urine, POC: NEGATIVE
Leukocyte esterase (UA POC): NEGATIVE
Nitrite, Urine, POC: NEGATIVE
Nitrites (UA POC): NEGATIVE
Protein (UA POC): NEGATIVE
Protein, Urine, POC: NEGATIVE
Specific Gravity, Urine, POC: 1.015 NA (ref 1.001–1.035)
Specific gravity (UA POC): 1.015 (ref 1.001–1.035)
Urobilinogen (UA POC): 0.2 (ref 0.2–1)
Urobilinogen, POC: 0.2 (ref 0.2–1)
pH (UA POC): 8.5 — AB (ref 4.6–8.0)
pH, Urine, POC: 8.5 NA — AB (ref 4.6–8.0)

## 2020-09-13 MED ORDER — HYDROCODONE-ACETAMINOPHEN 5 MG-325 MG TAB
5-325 mg | ORAL_TABLET | Freq: Four times a day (QID) | ORAL | 0 refills | Status: DC | PRN
Start: 2020-09-13 — End: 2020-09-14

## 2020-09-13 MED ORDER — TAMSULOSIN SR 0.4 MG 24 HR CAP
0.4 mg | ORAL_CAPSULE | Freq: Every day | ORAL | 1 refills | Status: DC
Start: 2020-09-13 — End: 2020-09-14

## 2020-09-13 NOTE — Addendum Note (Signed)
Addendum  Note by Myles Gip at 09/13/20 1100                Author: Myles Gip  Service: --  Author Type: Licensed Nurse       Filed: 09/13/20 1444  Encounter Date: 09/13/2020  Status: Signed          Editor: Myles Gip (Licensed Nurse)          Addended by: Myles Gip on: 09/13/2020 02:44 PM    Modules accepted: Orders

## 2020-09-13 NOTE — Progress Notes (Signed)
 Race L Schlottman presents today for   Chief Complaint   Patient presents with   . Blood in Urine   . Back Pain       1. Have you been to the ER, urgent care clinic since your last visit?  Hospitalized since your last visit? yes     2. Have you seen or consulted any other health care providers outside of the [facility] since your last visit? Yes [facility]     3. For patients aged 48-75: Has the patient had a colonoscopy / FIT/ Cologuard? Yes - no Care Gap present      If the patient is male:    4. For patients aged 62-74: Has the patient had a mammogram within the past 2 years? NA - based on age or sex  See top three    5. For patients aged 21-65: Has the patient had a pap smear? NA - based on age or sex

## 2020-09-13 NOTE — Progress Notes (Signed)
50 y.o. male who presents for evaluation.    He has a history of kidney stones for about 10 years now.  Initially noted on CT on the left side 7/12, then another episode on the right 4/17, another episode on the right 4/22 at [facility].  After that visit, he was recommended to go to the urologist but he did not go.  He thinks he has another flareup.  Yesterday started having pain in his back and flank on the left side radiating into the groin area.  This morning he saw some blood in the urine.  No nausea, vomiting, dysuria, fever or systemic complaints.  He works outside but hydrates aggressively, averages at least 120 cc of fluid per day.  He has never had stone analysis in the past.  No known history of gout, no mention of staghorn calculi on review of the scans.    Past Medical History:   Diagnosis Date   ??? Elevated blood pressure reading    ??? Hypertension    ??? Marijuana smoker 02/24/2020   ??? Multiple lipomas 02/24/2020   ??? Nephrolithiasis     7/12, 4/17, 4/22, 6/22 bilat   ??? Pain in joint of right shoulder    ??? Rotator cuff syndrome of left shoulder 2020    Dr Guido Sander   ??? TB (tuberculosis), treated    ??? Weight loss 02/24/2020     Current Outpatient Medications   Medication Sig   ??? HYDROcodone-acetaminophen (NORCO) 5-325 mg per tablet Take 1 Tablet by mouth every six (6) hours as needed for Pain for up to 5 days. Max Daily Amount: 4 Tablets.   ??? tamsulosin (FLOMAX) 0.4 mg capsule Take 1 Capsule by mouth daily.     No current facility-administered medications for this visit.     No Known Allergies    Visit Vitals  BP (!) 129/103   Pulse 72   Temp 98.6 ??F (37 ??C) (Temporal)   Resp 18   Ht 6\' 1"  (1.854 m)   Wt 152 lb (68.9 kg)   SpO2 99%   BMI 20.05 kg/m??   Back showed no CVA tenderness.  Abdomen soft, nontender, no hepatomegaly or masses.    Results for orders placed or performed in visit on 09/13/20   AMB POC URINALYSIS DIP STICK AUTO W/O MICRO   Result Value Ref Range    Color (UA POC) Yellow     Clarity (UA POC) Clear      Glucose (UA POC) Negative Negative    Bilirubin (UA POC) Negative Negative    Ketones (UA POC) Negative Negative    Specific gravity (UA POC) 1.015 1.001 - 1.035    Blood (UA POC) 2+ Negative    pH (UA POC) 8.5 (A) 4.6 - 8.0    Protein (UA POC) Negative Negative    Urobilinogen (UA POC) 0.2 mg/dL 0.2 - 1    Nitrites (UA POC) Negative Negative    Leukocyte esterase (UA POC) Negative Negative     Review of CT from the [facility] ER 4/22  IMPRESSION   There is is a 4 mm stone at the right bladder near the expected UVJ without hydronephrosis but could have recently passed. Additional nonobstructing bilateral nephrolithiasis.   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       Assessment and plan:  1.  Probable nephrolithiasis.  Discussed sending him to the ER or getting imaging, he declined for now.  He is requesting referral to the urologist which  was done.  We gave him a strainer.  I gave him Norco and Flomax as well, he will take Naprosyn 500 twice daily OTC, hydrate aggressively.  Call if worsening complaints or go straight to the ER          Instructions above were handwritten on the lab results sheet that I gave the patient today    Above conditions discussed at length and patient vocalized understanding.  All questions answered to patient satisfaction

## 2020-09-14 ENCOUNTER — Inpatient Hospital Stay
Admit: 2020-09-14 | Discharge: 2020-09-15 | Disposition: A | Discharge status: Home / Self Care | Payer: BLUE CROSS/BLUE SHIELD | Source: Home / Self Care | Attending: Family Medicine | Admitting: Family Medicine

## 2020-09-14 ENCOUNTER — Inpatient Hospital Stay: Admit: 2020-09-14 | Payer: BLUE CROSS/BLUE SHIELD | Attending: Emergency Medicine | Primary: Nurse Practitioner

## 2020-09-14 DIAGNOSIS — N201 Calculus of ureter: Secondary | ICD-10-CM

## 2020-09-14 DIAGNOSIS — N132 Hydronephrosis with renal and ureteral calculous obstruction: Secondary | ICD-10-CM

## 2020-09-14 LAB — URINALYSIS W/ RFLX MICROSCOPIC
Bilirubin, Urine: NEGATIVE
Bilirubin, Urine: NEGATIVE
Bilirubin: NEGATIVE
Bilirubin: NEGATIVE
Glucose, Ur: NEGATIVE mg/dL
Glucose, Ur: NEGATIVE mg/dL
Glucose: NEGATIVE mg/dL
Glucose: NEGATIVE mg/dL
Ketone: NEGATIVE mg/dL
Ketone: 15 mg/dL — AB
Ketones, Urine: NEGATIVE mg/dL
Ketones, Urine: 15 mg/dL — AB
Leukocyte Esterase, Urine: NEGATIVE
Leukocyte Esterase: NEGATIVE
Nitrite, Urine: NEGATIVE
Nitrite, Urine: NEGATIVE
Nitrites: NEGATIVE
Nitrites: NEGATIVE
Protein, UA: NEGATIVE mg/dL
Protein: NEGATIVE mg/dL
Specific Gravity, UA: 1.016 (ref 1.005–1.030)
Specific Gravity, UA: 1.013 (ref 1.005–1.030)
Specific gravity: 1.016 (ref 1.005–1.030)
Specific gravity: 1.013 (ref 1.005–1.030)
Urobilinogen, UA, POCT: 0.2 EU/dL (ref 0.2–1.0)
Urobilinogen, UA, POCT: 0.2 EU/dL (ref 0.2–1.0)
Urobilinogen: 0.2 EU/dL (ref 0.2–1.0)
Urobilinogen: 0.2 EU/dL (ref 0.2–1.0)
pH (UA): 8.5 — ABNORMAL HIGH (ref 5.0–8.0)
pH (UA): 8 (ref 5.0–8.0)
pH, UA: 8.5 — ABNORMAL HIGH (ref 5.0–8.0)
pH, UA: 8 (ref 5.0–8.0)

## 2020-09-14 LAB — CBC WITH AUTO DIFFERENTIAL
Basophils %: 1 % (ref 0–2)
Basophils Absolute: 0.1 10*3/uL (ref 0.0–0.1)
Eosinophils %: 0 % (ref 0–5)
Eosinophils Absolute: 0 10*3/uL (ref 0.0–0.4)
Granulocyte Absolute Count: 0.1 10*3/uL — ABNORMAL HIGH (ref 0.00–0.04)
Hematocrit: 38.8 % (ref 36.0–48.0)
Hemoglobin: 13.7 g/dL (ref 13.0–16.0)
Immature Granulocytes: 1 % — ABNORMAL HIGH (ref 0.0–0.5)
Lymphocytes %: 11 % — ABNORMAL LOW (ref 21–52)
Lymphocytes Absolute: 1.4 10*3/uL (ref 0.9–3.6)
MCH: 32.1 PG (ref 24.0–34.0)
MCHC: 35.3 g/dL (ref 31.0–37.0)
MCV: 90.9 FL (ref 78.0–100.0)
MPV: 9 FL — ABNORMAL LOW (ref 9.2–11.8)
Monocytes %: 6 % (ref 3–10)
Monocytes Absolute: 0.8 10*3/uL (ref 0.05–1.2)
NRBC Absolute: 0 10*3/uL (ref 0.00–0.01)
Neutrophils %: 82 % — ABNORMAL HIGH (ref 40–73)
Neutrophils Absolute: 10.8 10*3/uL — ABNORMAL HIGH (ref 1.8–8.0)
Nucleated RBCs: 0 PER 100 WBC
Platelets: 277 10*3/uL (ref 135–420)
RBC: 4.27 M/uL — ABNORMAL LOW (ref 4.35–5.65)
RDW: 13.2 % (ref 11.6–14.5)
WBC: 13.1 10*3/uL (ref 4.6–13.2)

## 2020-09-14 LAB — COMPREHENSIVE METABOLIC PANEL
ALT: 21 U/L (ref 16–61)
AST: 14 U/L (ref 10–38)
Albumin/Globulin Ratio: 1.2 (ref 0.8–1.7)
Albumin: 4.1 g/dL (ref 3.4–5.0)
Alkaline Phosphatase: 60 U/L (ref 45–117)
Anion Gap: 8 mmol/L (ref 3.0–18)
BUN: 15 MG/DL (ref 7.0–18)
Bun/Cre Ratio: 14 (ref 12–20)
CO2: 25 mmol/L (ref 21–32)
Calcium: 9.5 MG/DL (ref 8.5–10.1)
Chloride: 108 mmol/L (ref 100–111)
Creatinine: 1.09 MG/DL (ref 0.6–1.3)
EGFR IF NonAfrican American: >60 mL/min/{1.73_m2} (ref >60)
GFR African American: >60 mL/min/{1.73_m2} (ref >60)
Globulin: 3.3 g/dL (ref 2.0–4.0)
Glucose: 128 mg/dL — ABNORMAL HIGH (ref 74–99)
Potassium: 3.2 mmol/L — ABNORMAL LOW (ref 3.5–5.5)
Sodium: 141 mmol/L (ref 136–145)
Total Bilirubin: 0.6 MG/DL (ref 0.2–1.0)
Total Protein: 7.4 g/dL (ref 6.4–8.2)

## 2020-09-14 LAB — URINE MICROSCOPIC ONLY
BACTERIA, URINE: NEGATIVE /hpf
Bacteria: NEGATIVE /hpf
RBC, UA: NEGATIVE /hpf (ref 0–5)
RBC, UA: 0 /hpf (ref 0–5)
RBC: NEGATIVE /hpf (ref 0–5)
RBC: 0 /hpf (ref 0–5)
WBC, UA: 1 /hpf (ref 0–4)
WBC, UA: 0 /hpf (ref 0–4)
WBC: 1 /hpf (ref 0–4)
WBC: 0 /hpf (ref 0–4)

## 2020-09-14 LAB — METABOLIC PANEL, COMPREHENSIVE
A-G Ratio: 1.2 (ref 0.8–1.7)
ALT (SGPT): 21 U/L (ref 16–61)
AST (SGOT): 14 U/L (ref 10–38)
Albumin: 4.1 g/dL (ref 3.4–5.0)
Alk. phosphatase: 60 U/L (ref 45–117)
Anion gap: 8 mmol/L (ref 3.0–18)
BUN/Creatinine ratio: 14 (ref 12–20)
BUN: 15 MG/DL (ref 7.0–18)
Bilirubin, total: 0.6 MG/DL (ref 0.2–1.0)
CO2: 25 mmol/L (ref 21–32)
Calcium: 9.5 MG/DL (ref 8.5–10.1)
Chloride: 108 mmol/L (ref 100–111)
Creatinine: 1.09 MG/DL (ref 0.6–1.3)
GFR est AA: >60 mL/min/{1.73_m2} (ref >60)
GFR est non-AA: >60 mL/min/{1.73_m2} (ref >60)
Globulin: 3.3 g/dL (ref 2.0–4.0)
Glucose: 128 mg/dL — ABNORMAL HIGH (ref 74–99)
Potassium: 3.2 mmol/L — ABNORMAL LOW (ref 3.5–5.5)
Protein, total: 7.4 g/dL (ref 6.4–8.2)
Sodium: 141 mmol/L (ref 136–145)

## 2020-09-14 LAB — CBC WITH AUTOMATED DIFF
ABS. BASOPHILS: 0.1 10*3/uL (ref 0.0–0.1)
ABS. EOSINOPHILS: 0 10*3/uL (ref 0.0–0.4)
ABS. IMM. GRANS.: 0.1 10*3/uL — ABNORMAL HIGH (ref 0.00–0.04)
ABS. LYMPHOCYTES: 1.4 10*3/uL (ref 0.9–3.6)
ABS. MONOCYTES: 0.8 10*3/uL (ref 0.05–1.2)
ABS. NEUTROPHILS: 10.8 10*3/uL — ABNORMAL HIGH (ref 1.8–8.0)
ABSOLUTE NRBC: 0 10*3/uL (ref 0.00–0.01)
BASOPHILS: 1 % (ref 0–2)
EOSINOPHILS: 0 % (ref 0–5)
HCT: 38.8 % (ref 36.0–48.0)
HGB: 13.7 g/dL (ref 13.0–16.0)
IMMATURE GRANULOCYTES: 1 % — ABNORMAL HIGH (ref 0.0–0.5)
LYMPHOCYTES: 11 % — ABNORMAL LOW (ref 21–52)
MCH: 32.1 PG (ref 24.0–34.0)
MCHC: 35.3 g/dL (ref 31.0–37.0)
MCV: 90.9 FL (ref 78.0–100.0)
MONOCYTES: 6 % (ref 3–10)
MPV: 9 FL — ABNORMAL LOW (ref 9.2–11.8)
NEUTROPHILS: 82 % — ABNORMAL HIGH (ref 40–73)
NRBC: 0 PER 100 WBC
PLATELET: 277 10*3/uL (ref 135–420)
RBC: 4.27 M/uL — ABNORMAL LOW (ref 4.35–5.65)
RDW: 13.2 % (ref 11.6–14.5)
WBC: 13.1 10*3/uL (ref 4.6–13.2)

## 2020-09-14 MED ORDER — ONDANSETRON (PF) 4 MG/2 ML INJECTION
4 mg/2 mL | INTRAMUSCULAR | Status: AC
Start: 2020-09-14 — End: 2020-09-14
  Administered 2020-09-14: via INTRAVENOUS

## 2020-09-14 MED ORDER — KETOROLAC TROMETHAMINE 15 MG/ML INJECTION
15 mg/mL | INTRAMUSCULAR | Status: AC
Start: 2020-09-14 — End: 2020-09-14
  Administered 2020-09-14: 18:00:00 via INTRAVENOUS

## 2020-09-14 MED ORDER — TAMSULOSIN SR 0.4 MG 24 HR CAP
0.4 mg | ORAL | Status: AC
Start: 2020-09-14 — End: 2020-09-14
  Administered 2020-09-14: via ORAL

## 2020-09-14 MED ORDER — SODIUM CHLORIDE 0.9% BOLUS IV
0.9 % | Freq: Once | INTRAVENOUS | Status: AC
Start: 2020-09-14 — End: 2020-09-14
  Administered 2020-09-14: 18:00:00 via INTRAVENOUS

## 2020-09-14 MED ORDER — MORPHINE 4 MG/ML INTRAVENOUS SOLUTION
4 mg/mL | INTRAVENOUS | Status: AC
Start: 2020-09-14 — End: 2020-09-14
  Administered 2020-09-14: 18:00:00 via INTRAVENOUS

## 2020-09-14 MED ORDER — MORPHINE 4 MG/ML INTRAVENOUS SOLUTION
4 mg/mL | INTRAVENOUS | Status: DC
Start: 2020-09-14 — End: 2020-09-14

## 2020-09-14 MED ORDER — SODIUM CHLORIDE 0.9 % IV
INTRAVENOUS | Status: DC
Start: 2020-09-14 — End: 2020-09-15
  Administered 2020-09-14: 22:00:00 via INTRAVENOUS

## 2020-09-14 MED ORDER — MORPHINE 4 MG/ML INTRAVENOUS SOLUTION
4 mg/mL | INTRAVENOUS | Status: DC | PRN
Start: 2020-09-14 — End: 2020-09-15
  Administered 2020-09-14 – 2020-09-15 (×2): via INTRAVENOUS

## 2020-09-14 MED ORDER — MORPHINE 4 MG/ML INTRAVENOUS SOLUTION
4 mg/mL | INTRAVENOUS | Status: AC
Start: 2020-09-14 — End: 2020-09-14
  Administered 2020-09-14: 21:00:00 via INTRAVENOUS

## 2020-09-14 MED FILL — SODIUM CHLORIDE 0.9 % IV: INTRAVENOUS | Qty: 1000

## 2020-09-14 MED FILL — MORPHINE 4 MG/ML SYRINGE: 4 mg/mL | INTRAMUSCULAR | Qty: 1

## 2020-09-14 MED FILL — ONDANSETRON (PF) 4 MG/2 ML INJECTION: 4 mg/2 mL | INTRAMUSCULAR | Qty: 2

## 2020-09-14 MED FILL — TAMSULOSIN SR 0.4 MG 24 HR CAP: 0.4 mg | ORAL | Qty: 1

## 2020-09-14 MED FILL — KETOROLAC TROMETHAMINE 15 MG/ML INJECTION: 15 mg/mL | INTRAMUSCULAR | Qty: 1

## 2020-09-14 NOTE — Telephone Encounter (Signed)
Pt asking if work note may be extended to return to work tomorrow 09/15/2020. He had ov with Dr Tyler Pita yesterday    Stated he is still in discomfort. He thinks he may have passed something in shower this morning which gave him some relief. He was unable to catch it.

## 2020-09-14 NOTE — Progress Notes (Signed)
[facility] arrived for transport to [facility]

## 2020-09-14 NOTE — Progress Notes (Signed)
Report called to Dorthea Cove, RN.   Patient awake and alert, oriented x 4. LSCTA, room air sats 99%. VSS SR, pain controlled at this time. IV fluids NS @ 117ml/hr.

## 2020-09-14 NOTE — ED Notes (Signed)
 Pt actively vomiting, reports that he felt fine but I started to move around and starting throwing up.  Diaphoresis noted.  Vitals updated. Provider aware. Urology consulted.

## 2020-09-14 NOTE — ED Notes (Signed)
Pt resting on stretcher.  Pt in NAD at this time.  IVF infusing.

## 2020-09-14 NOTE — Telephone Encounter (Signed)
pls print extended note

## 2020-09-14 NOTE — H&P (Signed)
History and Physical    Patient: Tim Peterson MRN: [id number]  SSN: [id number]    Date of Birth: 1970-04-17  Age: 50 y.o.  Sex: male      Subjective:      Tim Peterson is a 50 y.o. male with a history of renal stones and marijuana use who presented to the ED secondary to a 1-2 day history of worsening left flank pain with nausea and vomiting. His pain is sharp and is worse with movement.  He went to work yesterday morning and had to go home due to the pain.  At home the pain continued to get worse so he called EMS to bring him to the ED.  He denies fevers or chills.  He denies dysuria but did notice some blood in his urine.     Past Medical History:   Diagnosis Date   ??? Elevated blood pressure reading    ??? Hypertension    ??? Marijuana smoker 02/24/2020   ??? Multiple lipomas 02/24/2020   ??? Nephrolithiasis     7/12, 4/17, 4/22, 6/22 bilat   ??? Pain in joint of right shoulder    ??? Rotator cuff syndrome of left shoulder 2020    Dr Guido Sander   ??? TB (tuberculosis), treated    ??? Weight loss 02/24/2020     Past Surgical History:   Procedure Laterality Date   ??? HX ORTHOPAEDIC      left leg pins      Family History   Problem Relation Age of Onset   ??? Hypertension Mother      Social History     Tobacco Use   ??? Smoking status: Never Smoker   ??? Smokeless tobacco: Never Used   Substance Use Topics   ??? Alcohol use: Yes     Comment: occasionally      Prior to Admission medications    Not on File        No Known Allergies    Review of Systems:  A comprehensive review of systems was negative except for that written in the History of Present Illness.    Objective:     Vitals:    2020/10/08 1930 2020-10-08 2000 08-Oct-2020 2030 Oct 08, 2020 2100   BP: (!) 155/74 113/70 113/74 119/78   Pulse:       Resp:       Temp:       SpO2: 99% 97% 97% 97%   Weight:       Height:            Physical Exam:  GENERAL: alert, cooperative, no distress, appears stated age  EYE: conjunctivae/corneas clear. PERRL, EOM's intact. Fundi benign  THROAT & NECK:  normal and no erythema or exudates noted.   LUNG: clear to auscultation bilaterally  HEART: regular rate and rhythm, S1, S2 normal, no murmur, click, rub or gallop  ABDOMEN: soft, left flank with CVA tenderness to light palpation, non-distended. Bowel sounds normal. No masses,  no organomegaly  EXTREMITIES:  extremities normal, atraumatic, no cyanosis or edema  SKIN: no rash or abnormalities  NEUROLOGIC: AOx3. Gait normal. Reflexes and motor strength normal and symmetric. Cranial nerves 2-12 and sensation grossly intact.  PSYCHIATRIC: non focal    Assessment:     Hospital Problems  Date Reviewed: 10-08-20          Codes Class Noted POA    Intractable abdominal pain ICD-10-CM: R10.9  ICD-9-CM: 789.00  October 08, 2020 Yes        * (  Principal) Left ureteral stone ICD-10-CM: N20.1  ICD-9-CM: 592.1  09/14/2020 Yes        Marijuana smoker ICD-10-CM: F12.90  ICD-9-CM: 305.20  02/24/2020 Yes              Plan:     Left flank pain secondary to left ureteral stone  [place] reviewed - consistent with bilateral nonobstructing renal stones with no evidence of hydronephrosis  Urology consulted in ED  Symptom control with IV morphine and zofran prn  Added flomax  IVF for now  Patient NPO in case urologic procedure is required tonight or tomorrow  UA reviewed - no evidence of UTI    H/o marijuana use  Discussed cessation    Code status - FULL CODE    Signed By: Patricia Nettle, DO     September 14, 2020

## 2020-09-14 NOTE — ED Notes (Signed)
Pt reports left flank pain and hematuria since yesterday.  Pt states known history of kidney stones.  Pt received 10mg  Morphine and 4mg  Zofran IV via EMS.

## 2020-09-14 NOTE — ED Provider Notes (Signed)
EMERGENCY DEPARTMENT HISTORY AND PHYSICAL EXAM    2:54 PM      Date: 09/14/2020  Patient Name: [facility]    History of Presenting Illness     Chief Complaint   Patient presents with   ??? Flank Pain   ??? Blood in Urine         History Provided By: Patient  Location/Duration/Severity/Modifying factors   Patient is a 50 year old male with a history of recent right ureteral stone without regular urology follow-up, shoulder pain, hypertension, marijuana smoking, recurrent kidney stones, the presents emergency department with complaint of left flank pain that began yesterday around 7 AM while he was working.  The patient works building tunnels and left flank pain while he is at work and it hurt him so badly he went home.  While he was at home he tried to hydrate himself and take pain meds at home however the pain continue to worsen until today he could no longer tolerate it so called EMS.  The patient denies any trauma, fevers, chills, or shortness of breath.  Patient was given 10 mg of morphine by EMS with Zofran and still has persistent pain.  The patient said his last stone episode was in April and he did not follow-up with urology and did not strain his urine and does not take medications on a regular basis to prevent him from developing stones.  Patient denies smoker, is occasional drinker, and regular uses marijuana.  Patient notes that his seen blood in his urine as well.          PCP: Alla Feeling, DNP    Current Facility-Administered Medications   Medication Dose Route Frequency Provider Last Rate Last Admin   ??? 0.9% sodium chloride infusion  125 mL/hr IntraVENous CONTINUOUS Everlena Cooper, MD 125 mL/hr at 09/14/20 1828 125 mL/hr at 09/14/20 1828   ??? morphine injection 4 mg  4 mg IntraVENous Q2H PRN Hall, Brittney N, DO       ??? ondansetron (ZOFRAN) injection 4 mg  4 mg IntraVENous NOW Hall, Brittney N, DO       ??? tamsulosin (FLOMAX) capsule 0.4 mg  0.4 mg Oral NOW Freddie Apley, DO            Past History     Past Medical History:  Past Medical History:   Diagnosis Date   ??? Elevated blood pressure reading    ??? Hypertension    ??? Marijuana smoker 02/24/2020   ??? Multiple lipomas 02/24/2020   ??? Nephrolithiasis     7/12, 4/17, 4/22, 6/22 bilat   ??? Pain in joint of right shoulder    ??? Rotator cuff syndrome of left shoulder 2020    Dr Guilford Shi   ??? TB (tuberculosis), treated    ??? Weight loss 02/24/2020       Past Surgical History:  Past Surgical History:   Procedure Laterality Date   ??? HX ORTHOPAEDIC      left leg pins       Family History:  Family History   Problem Relation Age of Onset   ??? Hypertension Mother        Social History:  Social History     Tobacco Use   ??? Smoking status: Never Smoker   ??? Smokeless tobacco: Never Used   Substance Use Topics   ??? Alcohol use: Yes     Comment: occasionally   ??? Drug use: Yes     Types: Marijuana  Comment: socially       Allergies:  No Known Allergies      Review of Systems       Review of Systems   Constitutional: Negative for activity change, fatigue and fever.   HENT: Negative for congestion and rhinorrhea.    Eyes: Negative for visual disturbance.   Respiratory: Negative for shortness of breath.    Cardiovascular: Negative for chest pain and palpitations.   Gastrointestinal: Negative for abdominal pain, diarrhea, nausea and vomiting.   Genitourinary: Positive for flank pain and hematuria. Negative for dysuria.   Musculoskeletal: Negative for back pain.   Skin: Negative for rash.   Neurological: Negative for dizziness, weakness and light-headedness.   All other systems reviewed and are negative.        Physical Exam     Visit Vitals  BP 118/75   Pulse 62   Temp 97.8 ??F (36.6 ??C)   Resp 20   Ht 6' (1.829 m)   Wt 73.5 kg (162 lb)   SpO2 97%   BMI 21.97 kg/m??         Physical Exam  Vitals and nursing note reviewed.   Constitutional:       General: He is in acute distress.      Appearance: He is well-developed.      Comments: Laying on his side, complaining of pain,  thin   HENT:      Head: Normocephalic and atraumatic.      Right Ear: External ear normal.      Left Ear: External ear normal.      Nose: Nose normal.   Eyes:      General: No scleral icterus.     Conjunctiva/sclera: Conjunctivae normal.      Pupils: Pupils are equal, round, and reactive to light.   Neck:      Thyroid: No thyromegaly.      Vascular: No JVD.      Trachea: No tracheal deviation.   Cardiovascular:      Rate and Rhythm: Normal rate and regular rhythm.      Heart sounds: Normal heart sounds. No murmur heard.  No friction rub. No gallop.    Pulmonary:      Effort: Pulmonary effort is normal.      Breath sounds: Normal breath sounds.   Chest:      Chest wall: No tenderness.   Abdominal:      General: Bowel sounds are normal. There is no distension.      Palpations: Abdomen is soft.      Tenderness: There is abdominal tenderness. There is left CVA tenderness. There is no guarding or rebound.      Comments: LLQ pain    Musculoskeletal:         General: No tenderness. Normal range of motion.      Cervical back: Normal range of motion and neck supple.   Lymphadenopathy:      Cervical: No cervical adenopathy.   Skin:     General: Skin is warm and dry.   Neurological:      Mental Status: He is alert and oriented to person, place, and time.      Cranial Nerves: No cranial nerve deficit.      Coordination: Coordination normal.      Comments: Symmetric strength, gait not observed   Psychiatric:         Behavior: Behavior normal.         Thought Content: Thought content normal.  Judgment: Judgment normal.           Diagnostic Study Results     Labs -  Recent Results (from the past 12 hour(s))   CBC WITH AUTOMATED DIFF    Collection Time: 09/14/20  2:00 PM   Result Value Ref Range    WBC 13.1 4.6 - 13.2 K/uL    RBC 4.27 (L) 4.35 - 5.65 M/uL    HGB 13.7 13.0 - 16.0 g/dL    HCT 38.8 36.0 - 48.0 %    MCV 90.9 78.0 - 100.0 FL    MCH 32.1 24.0 - 34.0 PG    MCHC 35.3 31.0 - 37.0 g/dL    RDW 13.2 11.6 - 14.5 %     PLATELET 277 135 - 420 K/uL    MPV 9.0 (L) 9.2 - 11.8 FL    NRBC 0.0 0 PER 100 WBC    ABSOLUTE NRBC 0.00 0.00 - 0.01 K/uL    NEUTROPHILS 82 (H) 40 - 73 %    LYMPHOCYTES 11 (L) 21 - 52 %    MONOCYTES 6 3 - 10 %    EOSINOPHILS 0 0 - 5 %    BASOPHILS 1 0 - 2 %    IMMATURE GRANULOCYTES 1 (H) 0.0 - 0.5 %    ABS. NEUTROPHILS 10.8 (H) 1.8 - 8.0 K/UL    ABS. LYMPHOCYTES 1.4 0.9 - 3.6 K/UL    ABS. MONOCYTES 0.8 0.05 - 1.2 K/UL    ABS. EOSINOPHILS 0.0 0.0 - 0.4 K/UL    ABS. BASOPHILS 0.1 0.0 - 0.1 K/UL    ABS. IMM. GRANS. 0.1 (H) 0.00 - 0.04 K/UL    DF AUTOMATED     METABOLIC PANEL, COMPREHENSIVE    Collection Time: 09/14/20  2:00 PM   Result Value Ref Range    Sodium 141 136 - 145 mmol/L    Potassium 3.2 (L) 3.5 - 5.5 mmol/L    Chloride 108 100 - 111 mmol/L    CO2 25 21 - 32 mmol/L    Anion gap 8 3.0 - 18 mmol/L    Glucose 128 (H) 74 - 99 mg/dL    BUN 15 7.0 - 18 MG/DL    Creatinine 1.09 0.6 - 1.3 MG/DL    BUN/Creatinine ratio 14 12 - 20      GFR est AA >60 >60 ml/min/1.26m    GFR est non-AA >60 >60 ml/min/1.769m   Calcium 9.5 8.5 - 10.1 MG/DL    Bilirubin, total 0.6 0.2 - 1.0 MG/DL    ALT (SGPT) 21 16 - 61 U/L    AST (SGOT) 14 10 - 38 U/L    Alk. phosphatase 60 45 - 117 U/L    Protein, total 7.4 6.4 - 8.2 g/dL    Albumin 4.1 3.4 - 5.0 g/dL    Globulin 3.3 2.0 - 4.0 g/dL    A-G Ratio 1.2 0.8 - 1.7     URINALYSIS W/ RFLX MICROSCOPIC    Collection Time: 09/14/20  4:50 PM   Result Value Ref Range    Color YELLOW      Appearance CLEAR      Specific gravity 1.013 1.005 - 1.030      pH (UA) 8.0 5.0 - 8.0      Protein Negative NEG mg/dL    Glucose Negative NEG mg/dL    Ketone 15 (A) NEG mg/dL    Bilirubin Negative NEG      Blood SMALL (A) NEG  Urobilinogen 0.2 0.2 - 1.0 EU/dL    Nitrites Negative NEG      Leukocyte Esterase TRACE (A) NEG     URINE MICROSCOPIC ONLY    Collection Time: 09/14/20  4:50 PM   Result Value Ref Range    WBC 0 to 3 0 - 4 /hpf    RBC 0 to 3 0 - 5 /hpf    Epithelial cells 1+ 0 - 5 /lpf       Radiologic  Studies -   [place] RETROPERITONEUM COMP   Final Result      Several small nonobstructing calculi are seen in both kidneys.  There is a small   cortical cyst in the left kidney, stable.  There is no hydronephrosis on the   right but there may be very mild hydronephrosis on the left.      There is a small probable calculus in the floor of the bladder somewhat to the   left of midline lying within some echogenic material.  This could represent a   calculus at the tip of an edematous left UVJ versus a passed calculus surrounded   by debris.  There is a suggestion of an hypoechoic tract leading to the   posterior wall of the bladder which might correspond to the ureter.                  Medical Decision Making   I am the first provider for this patient.    I reviewed the vital signs, available nursing notes, past medical history, past surgical history, family history and social history.    Vital Signs-Reviewed the patient's vital signs.      Records Reviewed: Nursing Notes, Old Medical Records, Previous Radiology Studies and Previous Laboratory Studies (Time of Review: 2:54 PM)    ED Course: Progress Notes, Reevaluation, and Consults:     The patient's urine is reassuring for acute infection and has a 13,000 white count.  The patient was feeling much better and then started having severe flank pain again.  We will give another dose of morphine and reevaluate. Sallye Ober, DO 5:57 PM    Patient was feeling much better and is ambulating and tolerating p.o.  I attempted to ambulate the patient the patient had increased pain and started vomiting.  We will discuss case with urology. Sallye Ober, DO 6:19 PM    The patient now has intractable pain and vomiting and discussed the case with urology and said that there is a chance the patient may have passed a stone and recommends admission to the hospital for medical management and if things worsen overnight we will place a stent.  The patient wanted to go home but I can  try to ambulate the patient he started to vomit again agrees that he needs to stay.  I discussed case Dr. Nevada Crane and will admit the patient for further care.Sallye Ober, DO 6:56 PM          Provider Notes (Medical Decision Making):   MDM  Number of Diagnoses or Management Options  Diagnosis management comments: Patient is a 50 year old male with a history of recurrent kidney stones that presents emergency department with complaint of left leg pain has been present for over 24 hours with hematuria.  Patient is afebrile and denies any fevers or chills at home.  Patient has required 10 mg of morphine by EMS and will provide Toradol as a recent renal function has been reassuring, hydrate, morphine as needed, and  obtain retroperitoneal ultrasound to evaluate for hydronephrosis instead of a CT scan which has been done several times in the past.  The patient's last stone was on the right and it was diagnosed by CT scan in April 2022.  We will send a urine culture as well.      Procedures      Diagnosis     Clinical Impression:   1. Left ureteral stone    2. Hydronephrosis, unspecified hydronephrosis type    3. Intractable pain        Disposition: DC    Follow-up Information    None          Patient's Medications   Start Taking    No medications on file   Continue Taking    No medications on file   These Medications have changed    No medications on file   Stop Taking    HYDROCODONE-ACETAMINOPHEN (NORCO) 5-325 MG PER TABLET    Take 1 Tablet by mouth every six (6) hours as needed for Pain for up to 5 days. Max Daily Amount: 4 Tablets.    TAMSULOSIN (FLOMAX) 0.4 MG CAPSULE    Take 1 Capsule by mouth daily.     Disclaimer: Sections of this note are dictated using utilizing voice recognition software.  Minor typographical errors may be present. If questions arise, please do not hesitate to contact me or call our department.

## 2020-09-15 ENCOUNTER — Encounter: Primary: Nurse Practitioner

## 2020-09-15 LAB — CBC WITH AUTO DIFFERENTIAL
Basophils %: 0 % (ref 0–2)
Basophils Absolute: 0 10*3/uL (ref 0.0–0.1)
Eosinophils %: 0 % (ref 0–5)
Eosinophils Absolute: 0 10*3/uL (ref 0.0–0.4)
Granulocyte Absolute Count: 0.1 10*3/uL — ABNORMAL HIGH (ref 0.00–0.04)
Hematocrit: 39.5 % (ref 36.0–48.0)
Hemoglobin: 13.1 g/dL (ref 13.0–16.0)
Immature Granulocytes: 0 % (ref 0.0–0.5)
Lymphocytes %: 6 % — ABNORMAL LOW (ref 21–52)
Lymphocytes Absolute: 0.9 10*3/uL (ref 0.9–3.6)
MCH: 31.4 PG (ref 24.0–34.0)
MCHC: 33.2 g/dL (ref 31.0–37.0)
MCV: 94.7 FL (ref 78.0–100.0)
MPV: 9.7 FL (ref 9.2–11.8)
Monocytes %: 7 % (ref 3–10)
Monocytes Absolute: 1 10*3/uL (ref 0.05–1.2)
NRBC Absolute: 0 10*3/uL (ref 0.00–0.01)
Neutrophils %: 87 % — ABNORMAL HIGH (ref 40–73)
Neutrophils Absolute: 12.8 10*3/uL — ABNORMAL HIGH (ref 1.8–8.0)
Nucleated RBCs: 0 PER 100 WBC
Platelets: 254 10*3/uL (ref 135–420)
RBC: 4.17 M/uL — ABNORMAL LOW (ref 4.35–5.65)
RDW: 13.5 % (ref 11.6–14.5)
WBC: 14.7 10*3/uL — ABNORMAL HIGH (ref 4.6–13.2)

## 2020-09-15 LAB — CULTURE, URINE
Culture result:: NO GROWTH
Culture: NO GROWTH

## 2020-09-15 LAB — BASIC METABOLIC PANEL
Anion Gap: 5 mmol/L (ref 3.0–18)
BUN: 13 MG/DL (ref 7.0–18)
Bun/Cre Ratio: 12 (ref 12–20)
CO2: 27 mmol/L (ref 21–32)
Calcium: 9.2 MG/DL (ref 8.5–10.1)
Chloride: 108 mmol/L (ref 100–111)
Creatinine: 1.05 MG/DL (ref 0.6–1.3)
EGFR IF NonAfrican American: >60 mL/min/{1.73_m2} (ref >60)
GFR African American: >60 mL/min/{1.73_m2} (ref >60)
Glucose: 130 mg/dL — ABNORMAL HIGH (ref 74–99)
Potassium: 3.7 mmol/L (ref 3.5–5.5)
Sodium: 140 mmol/L (ref 136–145)

## 2020-09-15 LAB — POCT GLUCOSE: POC Glucose: 116 mg/dL — ABNORMAL HIGH (ref 70–110)

## 2020-09-15 LAB — METABOLIC PANEL, BASIC
Anion gap: 5 mmol/L (ref 3.0–18)
BUN/Creatinine ratio: 12 (ref 12–20)
BUN: 13 MG/DL (ref 7.0–18)
CO2: 27 mmol/L (ref 21–32)
Calcium: 9.2 MG/DL (ref 8.5–10.1)
Chloride: 108 mmol/L (ref 100–111)
Creatinine: 1.05 MG/DL (ref 0.6–1.3)
GFR est AA: >60 mL/min/{1.73_m2} (ref >60)
GFR est non-AA: >60 mL/min/{1.73_m2} (ref >60)
Glucose: 130 mg/dL — ABNORMAL HIGH (ref 74–99)
Potassium: 3.7 mmol/L (ref 3.5–5.5)
Sodium: 140 mmol/L (ref 136–145)

## 2020-09-15 LAB — CBC WITH AUTOMATED DIFF
ABS. BASOPHILS: 0 10*3/uL (ref 0.0–0.1)
ABS. EOSINOPHILS: 0 10*3/uL (ref 0.0–0.4)
ABS. IMM. GRANS.: 0.1 10*3/uL — ABNORMAL HIGH (ref 0.00–0.04)
ABS. LYMPHOCYTES: 0.9 10*3/uL (ref 0.9–3.6)
ABS. MONOCYTES: 1 10*3/uL (ref 0.05–1.2)
ABS. NEUTROPHILS: 12.8 10*3/uL — ABNORMAL HIGH (ref 1.8–8.0)
ABSOLUTE NRBC: 0 10*3/uL (ref 0.00–0.01)
BASOPHILS: 0 % (ref 0–2)
EOSINOPHILS: 0 % (ref 0–5)
HCT: 39.5 % (ref 36.0–48.0)
HGB: 13.1 g/dL (ref 13.0–16.0)
IMMATURE GRANULOCYTES: 0 % (ref 0.0–0.5)
LYMPHOCYTES: 6 % — ABNORMAL LOW (ref 21–52)
MCH: 31.4 PG (ref 24.0–34.0)
MCHC: 33.2 g/dL (ref 31.0–37.0)
MCV: 94.7 FL (ref 78.0–100.0)
MONOCYTES: 7 % (ref 3–10)
MPV: 9.7 FL (ref 9.2–11.8)
NEUTROPHILS: 87 % — ABNORMAL HIGH (ref 40–73)
NRBC: 0 PER 100 WBC
PLATELET: 254 10*3/uL (ref 135–420)
RBC: 4.17 M/uL — ABNORMAL LOW (ref 4.35–5.65)
RDW: 13.5 % (ref 11.6–14.5)
WBC: 14.7 10*3/uL — ABNORMAL HIGH (ref 4.6–13.2)

## 2020-09-15 LAB — GLUCOSE, POC: Glucose (POC): 116 mg/dL — ABNORMAL HIGH (ref 70–110)

## 2020-09-15 MED ORDER — ONDANSETRON (PF) 4 MG/2 ML INJECTION
4 mg/2 mL | Freq: Four times a day (QID) | INTRAMUSCULAR | Status: DC | PRN
Start: 2020-09-15 — End: 2020-09-15

## 2020-09-15 MED ORDER — POLYETHYLENE GLYCOL 3350 17 GRAM (100 %) ORAL POWDER PACKET
17 gram | Freq: Every day | ORAL | Status: DC | PRN
Start: 2020-09-15 — End: 2020-09-15

## 2020-09-15 MED ORDER — TAMSULOSIN SR 0.4 MG 24 HR CAP
0.4 mg | ORAL_CAPSULE | Freq: Every day | ORAL | 0 refills | Status: AC
Start: 2020-09-15 — End: 2021-02-12

## 2020-09-15 MED ORDER — SODIUM CHLORIDE 0.9 % IJ SYRG
Freq: Three times a day (TID) | INTRAMUSCULAR | Status: DC
Start: 2020-09-15 — End: 2020-09-15
  Administered 2020-09-15 (×2): via INTRAVENOUS

## 2020-09-15 MED ORDER — TAMSULOSIN SR 0.4 MG 24 HR CAP
0.4 mg | Freq: Every day | ORAL | Status: DC
Start: 2020-09-15 — End: 2020-09-15
  Administered 2020-09-15: 13:00:00 via ORAL

## 2020-09-15 MED ORDER — ACETAMINOPHEN 325 MG TABLET
325 mg | Freq: Four times a day (QID) | ORAL | Status: DC | PRN
Start: 2020-09-15 — End: 2020-09-15

## 2020-09-15 MED ORDER — ACETAMINOPHEN 650 MG RECTAL SUPPOSITORY
650 mg | Freq: Four times a day (QID) | RECTAL | Status: DC | PRN
Start: 2020-09-15 — End: 2020-09-15

## 2020-09-15 MED ORDER — ONDANSETRON 4 MG TAB, RAPID DISSOLVE
4 mg | Freq: Three times a day (TID) | ORAL | Status: DC | PRN
Start: 2020-09-15 — End: 2020-09-15

## 2020-09-15 MED ORDER — SODIUM CHLORIDE 0.9 % IJ SYRG
INTRAMUSCULAR | Status: DC | PRN
Start: 2020-09-15 — End: 2020-09-15

## 2020-09-15 MED ORDER — CIPROFLOXACIN 500 MG TAB
500 mg | ORAL_TABLET | Freq: Two times a day (BID) | ORAL | 0 refills | Status: AC
Start: 2020-09-15 — End: 2020-09-22

## 2020-09-15 MED ORDER — DEXTROSE 5%-1/2 NORMAL SALINE IV
INTRAVENOUS | Status: DC
Start: 2020-09-15 — End: 2020-09-15
  Administered 2020-09-15: 03:00:00 via INTRAVENOUS

## 2020-09-15 MED FILL — BD POSIFLUSH NORMAL SALINE 0.9 % INJECTION SYRINGE: INTRAMUSCULAR | Qty: 40

## 2020-09-15 MED FILL — MORPHINE 4 MG/ML SYRINGE: 4 mg/mL | INTRAMUSCULAR | Qty: 1

## 2020-09-15 MED FILL — DEXTROSE 5%-1/2 NORMAL SALINE IV: INTRAVENOUS | Qty: 1000

## 2020-09-15 MED FILL — TAMSULOSIN SR 0.4 MG 24 HR CAP: 0.4 mg | ORAL | Qty: 1

## 2020-09-15 NOTE — Discharge Summary (Signed)
Discharge Summary by Claudius Sis, NP at 09/15/20 1412                Author: Claudius Sis, NP  Service: Hospitalist  Author Type: Nurse Practitioner       Filed: 09/16/20 0717  Date of Service: 09/15/20 1412  Status: Attested           Editor: Claudius Sis, NP (Nurse Practitioner)  Cosigner: Henrietta Hoover, DO at 09/24/20 1712          Attestation signed by Henrietta Hoover, DO at 09/24/20 1712          I have personally seen, evaluated and examined the patient. Agree with documentation of assessment and plan as documented by NP [facility] and case discussed.       Visit Vitals   BP  135/76      Pulse  (!) 52      Temp  98.4 ??F (36.9 ??C)      Resp  20      Ht  6' (1.829 m)      Wt  73.5 kg (162 lb)      SpO2  100%      BMI  21.97 kg/m??            General:  Alert, NAD   Cardiovascular:  RRR   Pulmonary:  LSC throughout; respiratory effort WNL   GI:  +BS in all four quadrants, soft, non-tender   GU: No CVA tenderness, urine clear.    Extremities:  No edema; 2+ dorsalis pedis pulses bilaterally   Neurology: Alert and oriented x4      Patient to be discharged home with urology FU. Currently on Flomax.       Henrietta Hoover, DO                                 [facility]   Discharge Summary             Patient: Tim Peterson  Age: 50 y.o.  DOB: [birth year] MR#:  [id number] SSN:  [id number]   PCP on record: Verdon Cummins, [place]   Admit date: 09/14/2020   Discharge date: 09/16/2020        Disposition:      [x] Home   [] Home with Home Health   [] SNF/NH   [] Rehab   [] Home with family    []  Alternate Facility:____________________      Admission Diagnoses:   Intractable abdominal pain [R10.9]   Left ureteral stone [N20.1]      Discharge Diagnoses:                              Left flank pain in setting of left ureteral stone   Hematuria    Possible mild left-sided hydronephrosis   Mild leukocytosis        Discharge Medications:          Discharge  Medication List as of 09/15/2020  1:44 PM              START taking these medications          Details        tamsulosin (FLOMAX) 0.4 mg capsule  Take 1 Capsule by mouth daily., Normal, Disp-30 Capsule, R-0  ciprofloxacin HCl (Cipro) 500 mg tablet  Take 1 Tablet by mouth two (2) times a day for 7 days., Normal, Disp-14 Tablet, R-0                      Consults:     - Urology (discussed with but not seen)      Procedures:   -   None      Significant Diagnostic Studies:    -  [place] RETROPERITONEUM COMP      Result Date: 09/14/2020   Several small nonobstructing calculi are seen in both kidneys.  There is a small cortical cyst in the left kidney, stable.  There is no hydronephrosis on the right but there may be very mild hydronephrosis on the left. There is a small probable calculus  in the floor of the bladder somewhat to the left of midline lying within some echogenic material.  This could represent a calculus at the tip of an edematous left UVJ versus a passed calculus surrounded by debris.  There is a suggestion of an hypoechoic  tract leading to the posterior wall of the bladder which might correspond to the ureter.         Hospital Course by Problem     1.  Left flank pain in setting of left ureteral stone -present on admission and resolved at time of discharge.  Of note patient did pass large  kidney stone during hospital stay and discussed with urology PA Sharlyne Pacas patient strongly wants to go home as symptoms have completely resolved, urology in agreement for plan.  Kidney stone sent for analysis prior to discharge, please follow for results.   Patient states he was prescribed but never started Flomax as an outpatient thus new prescription was given.  Also discussed prophylactic treatment for urinary tract infection with Cipro x7 days.  Urine culture is in progress -this provider will follow  for sensitivities.  Patient denies any symptoms of fevers or urinary tract infection otherwise.  Urology  follow-up upon discharge   2.  Hematuria -patient notes on admission in setting of #1, resolved prior to discharge   3.  Possible mild left-sided hydronephrosis -per ultrasound on admission, discussed with patient regarding able to repeat ultrasound however in setting of past kidney stone and patient's desire to return home he defers this repeat testing.   4.  Mild leukocytosis -likely in setting of early urinary tract infection versus reactive from acute process.  No fevers during admission.  Given 7-day course of Cipro upon discharge.        Today's examination of the patient revealed:          Subjective:        She reports mild soreness to bilateral flank but is absolutely adamant about no pain, no further hematuria or nausea vomiting since early this morning.  He reports passing urine without difficulty        Objective:     VS:    Visit Vitals      BP  135/76     Pulse  (!) 52     Temp  98.4 ??F (36.9 ??C)     Resp  20     Ht  6' (1.829 m)     Wt  73.5 kg (162 lb)     SpO2  100%        BMI  21.97 kg/m??         Tmax/24hrs: Temp (24hrs), Avg:98.4 ??F (  36.9 ??C), Min:98.4 ??F  (36.9 ??C), Max:98.4 ??F (36.9 ??C)       Input/Output: No intake or output data in the 24 hours ending 09/16/20 0705      General:  Alert, NAD   Cardiovascular:  RRR   Pulmonary:  LSC throughout; respiratory effort WNL   GI:  +BS in all four quadrants, soft, non-tender   GU: No CVA tenderness, urine clear, large dark stone visualized   Extremities:  No edema; 2+ dorsalis pedis pulses bilaterally   Neurology: Alert and oriented x4      Labs:     No results found for this or any previous visit (from the past 24 hour(s)).   Additional Data Reviewed:       Condition: Stable     Follow-up Appointments:     1. Your PCP: Verdon Cummins, DNP, within 5-7 days    Urology in 2-3 weeks      >30 minutes spent coordinating this discharge (review instructions/follow-up, prescriptions, preparing report for sign off)      Signed:   Claudius Sis, NP    09/16/2020   7:05 AM

## 2020-09-15 NOTE — Progress Notes (Signed)
Problem: Falls - Risk of  Goal: *Absence of Falls  Description: Document Bridgette Habermann Fall Risk and appropriate interventions in the flowsheet.  Outcome: Progressing Towards Goal  Note: Fall Risk Interventions:  Mobility Interventions: Patient to call before getting OOB         Medication Interventions: Patient to call before getting OOB,Teach patient to arise slowly    Elimination Interventions: Call light in reach,Patient to call for help with toileting needs,Urinal in reach              Problem: Patient Education: Go to Patient Education Activity  Goal: Patient/Family Education  Outcome: Progressing Towards Goal     Problem: Fluid Volume - Risk of, Imbalanced  Goal: *Balanced intake and output  Outcome: Progressing Towards Goal     Problem: Patient Education: Go to Patient Education Activity  Goal: Patient/Family Education  Outcome: Progressing Towards Goal     Problem: Pain  Goal: *Control of Pain  Outcome: Progressing Towards Goal  Goal: *PALLIATIVE CARE:  Alleviation of Pain  Outcome: Progressing Towards Goal     Problem: Patient Education: Go to Patient Education Activity  Goal: Patient/Family Education  Outcome: Progressing Towards Goal     Problem: Kidney Stone Care Plan (Adult)  Goal: *Elimination of kidney stone(s)  Outcome: Progressing Towards Goal  Goal: *Control of acute pain  Outcome: Progressing Towards Goal     Problem: Patient Education: Go to Patient Education Activity  Goal: Patient/Family Education  Outcome: Progressing Towards Goal

## 2020-09-15 NOTE — Progress Notes (Signed)
 Progress  Notes by Roswell Reusing at 09/15/20 1326                Author: Roswell Reusing  Service: Nutrition  Author Type: Dietetic Student       Filed: 09/15/20 1329  Date of Service: 09/15/20 1326  Status: Attested           Editor: Roswell Reusing (Dietetic Student)  Cosigner: Katrine Recardo DASEN, RD at 09/15/20 1600          Attestation signed by Katrine Recardo DASEN, RD at 09/15/20 1600          Chart and note reviewed. This Clinical research associate has independently evaluated the patient and agree with the assessment, plan, and recommendations as documented.        RD with plan to add Ensure Enlive to optimize nutrition intake as patient also comes this as part of usual diet.         Recardo DASEN Katrine, RD   Office: [phone]                               Comprehensive Nutrition Assessment      Type and Reason for Visit: Positive nutrition screen,Initial        Nutrition Recommendations/Plan:    1.  Recommend adding ONS of Ensure Enlive to trays BID to promote PO intake.   2.  Continue current diet and POC.   3.  Monitor intake/tolerance of meals/supplements and weight.         Malnutrition Assessment:   Malnutrition Status:  At risk for malnutrition (specify) (r/t slow continuous wt loss over past 1 year (-7.3%) and pt report of decreased appetite r/t hectic working hours) (09/15/20 1325)         Nutrition History and Allergies:    PMHx of HTN, TB (treated), renal stones and marijuana use. Pt came in c/o 1-2-day history of worsening left flank pain with N/V and hematuria. NKFA      Nutrition Assessment:     PT with MST score of 3. Wt hx reviewed: current- 162 lb, >6 months (02/24/20)- 160 lb 3.2 oz. 2 years (11/24/18)- 160 lb. wt stable x 2 years, per current stated wt. spoke to pt-  claimed their usual body weight was 175 lb and that they had been experiencing wt loss over the 1 past year r/t dental problems- tooth has since been removed. This represents a 7.4% loss over the past 1 year, per pt report- not significant.  Claimed to  have a decreased appetite for about the past year as well- eating one main meal/day with Ensure or carnation breakfast essentials in the morning r/t working early mornings and skipping lunches. PT was agreeable to having Ensure on trays while in patient.  fllavor preferences for vanilla or strawberry.       Nutrition Related Findings:     BM 6/29. Labs reviewed. Pertinent meds- 0.9% NaCl infusion @ 125 ml/hr, D5% @75  ml/hr (90 g dextrose and 306 kcal), zofran , miralax.  Wound Type: None      Current Nutrition Intake & Therapies :   Average Meal Intake: 1-25% (lunch tray, at bedside)   Average Supplement Intake: None ordered   ADULT DIET Regular; No Salt Added (3-4 gm)      Anthropometric Measures:   Height: 6' (182.9 cm)   Ideal Body Weight (IBW): 178 lbs ( 81 kg)   Admission Body Weight: 162 lb  Current Body Wt:  73.5 kg (162 lb), 91 %  IBW. Stated   Current BMI (kg/m2):  22   Usual Body Weight: 79.4 kg (175 lb) (1 year ago, per pt report)   % Weight Change (Calculated): -7.4                           BMI Category: Normal weight (BMI 18.5-24.9)      Estimated Daily Nutrient Needs:   Energy Requirements Based On: Formula (mifflin st jeor x 1.2-1.3)   Weight Used for Energy Requirements: Current   Energy (kcal/day):  1979 - 2144   Weight Used for Protein Requirements: Current (.8-1)   Protein (g/day):  59-74   Method Used for Fluid Requirements: 1 ml/kcal   Fluid (ml/day): 1979 - 2144      Nutrition Diagnosis:      Inadequate oral intake related to  psychological cause or life stress,other (specify) (pt described working schedule leads them to skip meals and has hx of tooth pain leading to decreased appetite) as evidenced by  weight loss,poor intake prior to admission,intake 0-25% (-7.4% over past 1 year)         Nutrition Interventions:    Food and/or Nutrient Delivery: Continue current diet,Start oral nutrition supplement   Nutrition Education/Counseling: No recommendations at this time,Education not  indicated   Coordination of Nutrition Care: Continue to monitor while inpatient   Plan of Care discussed with: Patient      Goals:       Goals: Meet at least 75% of estimated needs,by next RD assessment          Nutrition Monitoring and Evaluation:    Behavioral-Environmental Outcomes: None identified   Food/Nutrient Intake Outcomes: Supplement intake,Food and nutrient intake   Physical Signs/Symptoms Outcomes: Biochemical data,Meal time behavior,GI status,Weight      Discharge Planning:     Continue oral nutrition supplement,Continue current diet      Christine Breault   Contact: [phone]

## 2020-09-15 NOTE — Progress Notes (Signed)
 Chaplain conducted an initial consultation and Spiritual Assessment for [employer], who is a 49 y.o.,male. Patient's Primary Language is: [place].   According to the patient's EMR Religious Affiliation is: No preference.     The reason the Patient came to the hospital is:   Patient Active Problem List    Diagnosis Date Noted   . Intractable abdominal pain 09/14/2020   . Left ureteral stone 09/14/2020   . Nephrolithiasis    . Marijuana smoker 02/24/2020   . Multiple lipomas 02/24/2020   . Weight loss 02/24/2020        The Chaplain provided the following Interventions:  Initiated a relationship of care and support with patient in room 407 today.  Listened empathically as he related his story of being here and his hopes of going home this afternoon since the pain is now better. Patient may be interested in doing an advance directive just not today. Currently there is not one present.  Provided chaplaincy education.  Provided information about Spiritual Care Services.  Offered prayer and assurance of continued prayers on patients behalf.   Chart reviewed.    The following outcomes were achieved:  Patient shared limited information about both their medical narrative and spiritual journey/beliefs.  Patient processed feeling about current hospitalization.  Patient expressed gratitude for pastoral care visit.    Assessment:  Patient does not have any religious/cultural needs that will affect patient's preferences in health care.  There are no further spiritual or religious issues which require Spiritual Care Services interventions at this time.       Plan:  Chaplains will continue to follow and will provide pastoral care on an as needed/requested basis    .Elia Mauro Ysidro Jerline Haze Elia   Spiritual Care   [phone]

## 2020-09-15 NOTE — Progress Notes (Signed)
Pt is progressing towards goals.

## 2020-09-16 LAB — CULTURE, URINE
Culture result:: NO GROWTH
Culture: NO GROWTH

## 2020-09-19 ENCOUNTER — Ambulatory Visit: Admit: 2020-09-19 | Discharge: 2020-09-19 | Attending: Medical | Primary: Nurse Practitioner

## 2020-09-19 ENCOUNTER — Ambulatory Visit: Attending: Medical | Primary: Nurse Practitioner

## 2020-09-19 DIAGNOSIS — N201 Calculus of ureter: Secondary | ICD-10-CM

## 2020-09-19 NOTE — Progress Notes (Signed)
Progress Notes by Julien Girtorbett, Solita Macadam, PA at 09/19/20 1330                Author: Julien Girtorbett, Fusaye Wachtel, PA  Service: --  Author Type: Physician Assistant       Filed: 09/19/20 1630  Encounter Date: 09/19/2020  Status: Signed          Editor: Julien Girtorbett, Jamarian Jacinto, PA (Physician Assistant)                                ASSESSMENT:    1. Urolithiasis.     CT Abd/Pelvis w/o contrast 06/21/20: 4 mm R UVJ stone, B nonobstructing stones.     RUS 09/14/20: mild L hydronephrosis and possible stone L UVJ vs bladder    Labs 09/14/20- WBC 14.7, Cr 1.0, Urine cx neg.     Stone analysis pending.                   PLAN:     1. Discussed observation vs URS for remaining stones with pt. He would like to avoid surgery at this time.   2. Will check RUS to ensure hydronephrosis has resolved.    3. Advised to increase water, decrease salt intake, and increase citrate.    4. RTC in 4 weeks with RUS prior.    5. Advised to go to ER for fevers, intractable pain, intractable vomiting.    6. Pt will ultimately need 24 hour urine in 3 months time.              DISCUSSION:   We discussed the options for management of kidney stones, including observation, ESWL, ureteroscopy with laser lithotripsy, and PCNL.   The risks and benefits of each option were discussed and the patient desires management with observation.       ESWL: risks and benefits of ESWL were outlined including infection, bleeding, pain, steinstrasse, kidney injury, need for ancillary treatments, and global anesthesia risks including but not limited to CVA, MI, DVT, PE, pneumonia, and death.        Ureteroscopy: risks and benefits of ureteroscopy were outlined, including infection, bleeding, pain, temporary ureteral stent and associated stent bother, ureteral injury, ureteral stricture, need for ancillary treatments, and global anesthesia risks  including but not limited to CVA, MI, DVT, PE, pneumonia, and death.      PCNL: risks and benefits of PCNL were outlined including infection,  bleeding, blood transfusion, pain, pneumothorax, bowel injury, persistent urine leak, need for ancillary treatments, and global anesthesia risks including but not limited to CVA, MI,  DVT, PE, pneumonia, and death.             Chief Complaint       Patient presents with        ?  Hydronephrosis             left        ?  Kidney Stone           HISTORY OF PRESENT ILLNESS:  Tim Peterson  is a 50 y.o. male who is seen  in consultation as referred by Dr. [place] [employer], Maryanna ShapeAmanda M, DNP for kdiney stone. Pt initially started with abrupt onset of L flank pain on 09/13/20.  He went to his PCP and was given rx's for flomax and pain meds. He developed worsening pain 09/14/20, and went to the ER. Pt was admitted for pain control. He had RUS which showed  B nephrolithiasis and L hydronephrosis. He passed the stone 09/15/20. Pt still  with mild residual pain on the left. No fevers, n/v, dysuria, or hematuria since discharge.   Pt has passed about 10 stones in the past. He has never needed a procedure. He is not followed by urology.       PMH- htn   Meds- none    Allergies- none   SH- no tobacco        Fluids- water, soda (root beer)       REVIEW OF LABS & IMAGING:     Lab Results         Component  Value  Date/Time            Prostate Specific Ag  0.6  02/24/2020 12:00 AM          CT Abd/Pelvis w/o contrast 06/21/20: There is is a 4 mm stone at the right bladder near the expected UVJ without hydronephrosis but  could have recently passed. Additional nonobstructing bilateral nephrolithiasis.   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     RUS 09/14/20:   IMPRESSION   ??   Several small nonobstructing calculi are seen in both kidneys.  There is a small   cortical cyst in the left kidney, stable.  There is no hydronephrosis on the   right but there may be very mild hydronephrosis on the left.   ??   There is a small probable calculus in the floor of the bladder somewhat to the   left of midline lying within some echogenic  material.  This could represent a   calculus at the tip of an edematous left UVJ versus a passed calculus surrounded   by debris.  There is a suggestion of an hypoechoic tract leading to the   posterior wall of the bladder which might correspond to the ureter.   ??   ??      No flowsheet data found.           Past Medical History:        Diagnosis  Date         ?  Elevated blood pressure reading       ?  Hypertension       ?  Marijuana smoker  02/24/2020     ?  Multiple lipomas  02/24/2020     ?  Nephrolithiasis            7/12, 4/17, 4/22, 6/22 bilat         ?  Pain in joint of right shoulder       ?  Rotator cuff syndrome of left shoulder  2020          Dr Guido Sander         ?  TB (tuberculosis), treated           ?  Weight loss  02/24/2020             Past Surgical History:         Procedure  Laterality  Date          ?  HX ORTHOPAEDIC              left leg pins             Social History          Tobacco Use         ?  Smoking status:  Never Smoker     ?  Smokeless tobacco:  Never Used       Substance Use Topics         ?  Alcohol use:  Yes             Comment: occasionally         ?  Drug use:  Yes              Types:  Marijuana             Comment: socially           No Known Allergies        Family History         Problem  Relation  Age of Onset          ?  Hypertension  Mother               Current Outpatient Medications          Medication  Sig  Dispense  Refill           ?  oxyCODONE IR (ROXICODONE) 5 mg immediate release tablet  Take 5 mg by mouth every six (6) hours as needed.         ?  tamsulosin (FLOMAX) 0.4 mg capsule  Take 1 Capsule by mouth daily.  30 Capsule  0     ?  ciprofloxacin HCl (Cipro) 500 mg tablet  Take 1 Tablet by mouth two (2) times a day for 7 days.  14 Tablet  0     ?  acetaminophen (TYLENOL) 500 mg tablet  Take 1,000 mg by mouth every eight (8) hours as needed. (Patient not taking: Reported on 09/19/2020)         ?  naproxen (NAPROSYN) 500 mg tablet  Take 500 mg by mouth two (2) times a day.  (Patient not taking: Reported on 09/19/2020)               ?  ondansetron (ZOFRAN ODT) 4 mg disintegrating tablet  TAKE 1-2 TABS DISSOLVE UNDER THE TONGUE AS NEEDED BEFORE THE BOWEL PREP IF YOU NAUSEOUS (Patient not taking: Reported on 09/19/2020)               Review of Systems   Constitutional: Fever: No   Skin: Rash: No   HEENT: Hearing difficulty: No   Eyes: Blurred vision: No   Cardiovascular: Chest pain: No   Respiratory: Shortness of breath: No   Gastrointestinal: Nausea/vomiting: No   Musculoskeletal: Back pain: Yes   Neurological: Weakness: No   Psychological: Memory loss: No   Comments/additional findings:       PHYSICAL EXAMINATION:    Visit Vitals      Ht  6\' 1"  (1.854 m)     Wt  160 lb (72.6 kg)        BMI  21.11 kg/m??        Constitutional: WDWN, Pleasant and appropriate affect, No acute distress.     CV:  No peripheral swelling noted   Respiratory: No respiratory distress or difficulties   Abdomen:  No abdominal masses or tenderness. No CVA tenderness. No inguinal hernias noted.    Skin: No jaundice.     Neuro/Psych:  Alert and oriented x 3, affect appropriate.    Lymphatic:   No enlarged inguinal lymph nodes.           LABS TODAY:     No results found for this or any previous visit (from the past 12  hour(s)).       A copy of today's office visit with all pertinent imaging results and labs were sent to the referring physician.           Julien Girt, [place]    [facility]   [phone]              Encounter Diagnoses              ICD-10-CM  ICD-9-CM          1.  Ureterolithiasis   N20.1  592.1

## 2020-09-21 ENCOUNTER — Ambulatory Visit
Admit: 2020-09-21 | Discharge: 2020-09-21 | Payer: BLUE CROSS/BLUE SHIELD | Attending: Internal Medicine | Primary: Nurse Practitioner

## 2020-09-21 ENCOUNTER — Ambulatory Visit: Attending: Internal Medicine | Primary: Nurse Practitioner

## 2020-09-21 DIAGNOSIS — N200 Calculus of kidney: Secondary | ICD-10-CM

## 2020-09-21 LAB — STONE ANALYSIS, URINARY
CA OXALATE DIHYDRATE, 120693: 30 %
CA OXALATE MONOHYDR., 120694: 70 %
Ca oxalate dihydrate: 30 %
Ca oxalate monohydrate: 70 %
WEIGHT, 120402: 22 mg
Weight: 22 mg

## 2020-09-21 NOTE — Progress Notes (Signed)
 Tim Peterson presents today for   Chief Complaint   Patient presents with   . Hospital Follow Up       Is someone accompanying this pt? no    Is the patient using any DME equipment during OV? no    Depression Screening:  3 most recent PHQ Screens 09/21/2020   Little interest or pleasure in doing things Not at all   Feeling down, depressed, irritable, or hopeless Not at all   Total Score PHQ 2 0       Learning Assessment:  No flowsheet data found.    Health Maintenance reviewed and discussed and ordered per Provider.    Health Maintenance Due   Topic Date Due   . Hepatitis C Screening  Never done   . DTaP/Tdap/Td series (1 - Tdap) Never done   . COVID-19 Vaccine (3 - Booster for Moderna series) 04/19/2020   .    1. Have you been to the ER, urgent care clinic since your last visit?  Hospitalized since your last visit? yes 09-14-2020 [facility]    2. Have you seen or consulted any other health care providers outside of the [facility] since your last visit? No     3. For patients aged 20-75: Has the patient had a colonoscopy / FIT/ Cologuard? Yes - no Care Gap present      If the patient is male:    4. For patients aged 6-74: Has the patient had a mammogram within the past 2 years? NA - based on age or sex      70. For patients aged 21-65: Has the patient had a pap smear? NA - based on age or sex

## 2020-09-21 NOTE — Progress Notes (Signed)
HPI     [facility] is here after being seen in the ER/hospital for flank pain and gross hematuria.  He passed a large stone during his hospitalization.  He had a CT scan which showed some mild left hydronephrosis, which could have been caused by recent stone passage, and a stone in his bladder.  The stone has been sent for analysis, and he saw urology earlier this week.  He has been advised to start drinking water with lemon, and reports that he still has some stones in his kidneys.  He reports he used to be treated for hypertension, but lost a significant amount of weight and his blood pressure has been normal today.        Past Medical History:   Diagnosis Date   ??? Elevated blood pressure reading    ??? Hypertension    ??? Marijuana smoker 02/24/2020   ??? Multiple lipomas 02/24/2020   ??? Nephrolithiasis     7/12, 4/17, 4/22, 6/22 bilat   ??? Pain in joint of right shoulder    ??? Rotator cuff syndrome of left shoulder 2020    Dr Guido Sander   ??? TB (tuberculosis), treated    ??? Weight loss 02/24/2020        Past Surgical History:   Procedure Laterality Date   ??? HX ORTHOPAEDIC      left leg pins        Current Outpatient Medications   Medication Sig Dispense Refill   ??? oxyCODONE IR (ROXICODONE) 5 mg immediate release tablet Take 5 mg by mouth every six (6) hours as needed.     ??? tamsulosin (FLOMAX) 0.4 mg capsule Take 1 Capsule by mouth daily. 30 Capsule 0   ??? ciprofloxacin HCl (Cipro) 500 mg tablet Take 1 Tablet by mouth two (2) times a day for 7 days. 14 Tablet 0        No Known Allergies     Social History     Socioeconomic History   ??? Marital status: SINGLE     Spouse name: Not on file   ??? Number of children: Not on file   ??? Years of education: Not on file   ??? Highest education level: Not on file   Occupational History   ??? Not on file   Tobacco Use   ??? Smoking status: Never Smoker   ??? Smokeless tobacco: Never Used   Substance and Sexual Activity   ??? Alcohol use: Yes     Comment: occasionally   ??? Drug use: Yes     Types:  Marijuana     Comment: socially   ??? Sexual activity: Yes     Partners: Female   Other Topics Concern   ??? Not on file   Social History Narrative    ** Merged History Encounter **          Social Determinants of Health     Financial Resource Strain:    ??? Difficulty of Paying Living Expenses: Not on file   Food Insecurity:    ??? Worried About Programme researcher, broadcasting/film/video in the Last Year: Not on file   ??? Ran Out of Food in the Last Year: Not on file   Transportation Needs:    ??? Freight forwarder (Medical): Not on file   ??? Lack of Transportation (Non-Medical): Not on file   Physical Activity:    ??? Days of Exercise per Week: Not on file   ??? Minutes of Exercise per Session:  Not on file   Stress:    ??? Feeling of Stress : Not on file   Social Connections:    ??? Frequency of Communication with Friends and Family: Not on file   ??? Frequency of Social Gatherings with Friends and Family: Not on file   ??? Attends Religious Services: Not on file   ??? Active Member of Clubs or Organizations: Not on file   ??? Attends Banker Meetings: Not on file   ??? Marital Status: Not on file   Intimate Partner Violence:    ??? Fear of Current or Ex-Partner: Not on file   ??? Emotionally Abused: Not on file   ??? Physically Abused: Not on file   ??? Sexually Abused: Not on file   Housing Stability:    ??? Unable to Pay for Housing in the Last Year: Not on file   ??? Number of Places Lived in the Last Year: Not on file   ??? Unstable Housing in the Last Year: Not on file        Family History   Problem Relation Age of Onset   ??? Hypertension Mother            Visit Vitals  BP (!) 132/90   Pulse 69   Temp 96.8 ??F (36 ??C) (Skin)   Resp 18   Ht 6\' 1"  (1.854 m)   Wt 154 lb 9.6 oz (70.1 kg)   SpO2 100%   BMI 20.40 kg/m??        Review of Systems   Constitutional: Negative for chills and fever.   Eyes: Negative for blurred vision.   Respiratory: Negative for shortness of breath.    Cardiovascular: Negative for chest pain.   Gastrointestinal: Negative for blood in  stool.   Genitourinary: Negative for dysuria and flank pain.        No recurrence of gross hematuria.       Physical Exam  Constitutional:       General: He is not in acute distress.     Appearance: He is not ill-appearing.   Eyes:      General: No scleral icterus.     Conjunctiva/sclera: Conjunctivae normal.   Cardiovascular:      Rate and Rhythm: Normal rate and regular rhythm.   Pulmonary:      Effort: Pulmonary effort is normal.      Breath sounds: Normal breath sounds.   Abdominal:      Palpations: Abdomen is soft.      Tenderness: There is no abdominal tenderness. There is left CVA tenderness. There is no right CVA tenderness.   Musculoskeletal:      Comments: No clubbing, cyanosis, or edema.  Calves nontender, no cords.   Skin:     General: Skin is warm and dry.   Neurological:      Mental Status: He is alert and oriented to person, place, and time.   Psychiatric:         Mood and Affect: Mood normal.                  Diagnoses and all orders for this visit:    1. Nephrolithiasis  Comments:  Presented with gross hematuria and pain, passed large stone in ER, has urology follow-up, stone analysis in progress, will drink lemon water    2. Neutrophilic leukocytosis  Comments:  Mild, on presentation in the ER, WBC 14,700.  Reviewed, discussed likely due to pain/stress, no evidence of infection,  urine culture negative, no lab recheck                  Bobby Rumpf, MD

## 2020-10-03 ENCOUNTER — Ambulatory Visit: Payer: BLUE CROSS/BLUE SHIELD | Primary: Nurse Practitioner

## 2020-10-03 NOTE — Telephone Encounter (Signed)
Pt said he has a history of stones ,he said he had blood in his urine and thinks  he passed one yesterday , he stayed home from work today because he is in a lot of pain ,he is asking to be seen ,he said he can't keep going the ER and missing work  please advise

## 2020-10-03 NOTE — Telephone Encounter (Signed)
Patient PCP office called in requesting the nurse for the patient. She stated that the patient called their office requesting a note for work. She stated the patient asked her to call to request it because he couldn't get through. The patient stated that he has been in pain and is requesting a note from work until Friday 10/06/20 when he is scheduled for [place]. Please review and contact patient to advise.

## 2020-10-03 NOTE — Telephone Encounter (Signed)
Patient reached and made aware to contact Urology in regards to work note putting him out until Ultrasound is complete. Patient states he has missed 2 days of work due to the pain and the hematuria has not resolved. Called over and spoke to Katrina at [facility], she stated she would send a message to [employer] for letter request and they would call pt back with update.

## 2020-10-04 NOTE — Telephone Encounter (Signed)
Returned call fr pt.    He needs an extension for his letter/work. He continues to have pain and his RUS is sched this Fri and HBV. He wants to extend the time to Mon, July 25th. He said he will pick up the letter just let him know when he can come. He can be reached at [phone].

## 2020-10-06 ENCOUNTER — Inpatient Hospital Stay: Admit: 2020-10-06 | Payer: BLUE CROSS/BLUE SHIELD | Attending: Medical | Primary: Nurse Practitioner

## 2020-10-06 DIAGNOSIS — N132 Hydronephrosis with renal and ureteral calculous obstruction: Secondary | ICD-10-CM

## 2020-10-06 NOTE — Telephone Encounter (Signed)
Patient will come in and pick up his work note .10/06/20

## 2020-10-20 ENCOUNTER — Ambulatory Visit: Admit: 2020-10-20 | Discharge: 2020-10-20 | Attending: Medical | Primary: Nurse Practitioner

## 2020-10-20 ENCOUNTER — Ambulatory Visit: Attending: Medical | Primary: Nurse Practitioner

## 2020-10-20 DIAGNOSIS — N200 Calculus of kidney: Secondary | ICD-10-CM

## 2020-10-20 LAB — AMB POC URINALYSIS DIP STICK AUTO W/O MICRO
Bilirubin (UA POC): NEGATIVE
Bilirubin, Urine, POC: NEGATIVE
Glucose (UA POC): NEGATIVE
Glucose, Urine, POC: NEGATIVE
Ketones (UA POC): NEGATIVE
Ketones, Urine, POC: NEGATIVE
Nitrite, Urine, POC: NEGATIVE
Nitrites (UA POC): NEGATIVE
Protein (UA POC): NEGATIVE
Protein, Urine, POC: NEGATIVE
Specific Gravity, Urine, POC: 1.025 NA (ref 1.001–1.035)
Specific gravity (UA POC): 1.025 (ref 1.001–1.035)
Urobilinogen (UA POC): 1 (ref 0.2–1)
Urobilinogen, POC: 1 (ref 0.2–1)
pH (UA POC): 6 (ref 4.6–8.0)
pH, Urine, POC: 6 NA (ref 4.6–8.0)

## 2020-10-20 NOTE — Progress Notes (Signed)
Progress Notes by Julien Girt, PA at 10/20/20 1315                Author: Julien Girt, PA  Service: --  Author Type: Physician Assistant       Filed: 10/20/20 1628  Encounter Date: 10/20/2020  Status: Signed          Editor: Julien Girt, PA (Physician Assistant)                                ASSESSMENT:    1. Urolithiasis.     CT Abd/Pelvis w/o contrast 06/21/20: 4 mm R UVJ stone, B nonobstructing stones.     RUS 09/14/20: mild L hydronephrosis and possible stone L UVJ vs bladder    Labs 09/14/20- WBC 14.7, Cr 1.0, Urine cx neg.     Stone analysis 09/15/2020: 100% calcium oxalate    Rest 10/06/2020: Mild left pelviectasis, no hydronephrosis.                  PLAN:     1. RUS reviewed.  Still with mild pelviectasis and bilateral nephrolithiasis.  Asymptomatic currently.   2. Discussed basic stone prevention including increasing water, decreasing salt, increasing citrus.   3.  Check Litholink   4.  RTC in 3 months with Rus prior.            DISCUSSION:   Empiric Stone Prevention Recommendations were discussed:    ??  Increase fluid consumption to make at least 2-2.5 litre of urine per day or consume at least 3 liters of fluid daily.    ??  Decrease dietary salt intake (2 gm sodium daily) -- to lower urinary Calcium    ??  Avoid dark drinks (ie, Coffee, Tea, Colas), nuts, chocolate -- to lower urinary Oxalate    ??  Lemonade: 4 oz lemon juice mixed with 2 litre water, sweetened to taste -- to increase urinary Citrate           Chief Complaint       Patient presents with        ?  Kidney Stone           HISTORY OF PRESENT ILLNESS:  Tim Peterson  is a 50 y.o. male who is seen  in consultation as referred by Dr. Governor Rooks, Maryanna Shape, DNP for kdiney stone. Pt initially started with abrupt onset of L flank pain on 09/13/20.  He went to his PCP and was given rx's for flomax and pain meds. He developed worsening pain 09/14/20, and went to the ER. Pt was admitted for pain control. He had RUS which showed B  nephrolithiasis and L hydronephrosis. He passed the stone 09/15/20. Pt still  with mild residual pain on the left. No fevers, n/v, dysuria, or hematuria since discharge.   Pt has passed about 10 stones in the past. He has never needed a procedure. He is not followed by urology.       PMH- htn   Meds- none    Allergies- none   SH- no tobacco        Fluids- water, soda (root beer)       Interval Hx: Pt passed one more stone after last visit. No residual pain. No fevers, abdominal pain, flank pain, n/v, dysuria, or hematuria.       REVIEW OF LABS & IMAGING:     Lab Results  Component  Value  Date/Time            Prostate Specific Ag  0.6  02/24/2020 12:00 AM          CT Abd/Pelvis w/o contrast 06/21/20: There is is a 4 mm stone at the right bladder near the expected UVJ without hydronephrosis but  could have recently passed. Additional nonobstructing bilateral nephrolithiasis.   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      RUS 10/06/20:   IMPRESSION       1.  Unchanged small nonobstructing calculi in bilateral kidneys.       2.  Left kidney: stable cortical cyst in the upper pole.  Improved left   hydronephrosis with residual minimally distended renal pelvis.       3.  Resolution of bladder debris, stone from previous exam.         RUS 09/14/20:   IMPRESSION       Several small nonobstructing calculi are seen in both kidneys.  There is a small   cortical cyst in the left kidney, stable.  There is no hydronephrosis on the   right but there may be very mild hydronephrosis on the left.       There is a small probable calculus in the floor of the bladder somewhat to the   left of midline lying within some echogenic material.  This could represent a   calculus at the tip of an edematous left UVJ versus a passed calculus surrounded   by debris.  There is a suggestion of an hypoechoic tract leading to the   posterior wall of the bladder which might correspond to the ureter.              No flowsheet data  found.           Past Medical History:        Diagnosis  Date         ?  Elevated blood pressure reading       ?  Hypertension       ?  Marijuana smoker  02/24/2020     ?  Multiple lipomas  02/24/2020     ?  Nephrolithiasis            7/12, 4/17, 4/22, 6/22 bilat         ?  Pain in joint of right shoulder       ?  Rotator cuff syndrome of left shoulder  2020          Dr Guido Sander         ?  TB (tuberculosis), treated           ?  Weight loss  02/24/2020             Past Surgical History:         Procedure  Laterality  Date          ?  HX ORTHOPAEDIC              left leg pins             Social History          Tobacco Use         ?  Smoking status:  Never     ?  Smokeless tobacco:  Never       Substance Use Topics         ?  Alcohol use:  Yes  Comment: occasionally         ?  Drug use:  Yes              Types:  Marijuana             Comment: socially           No Known Allergies        Family History         Problem  Relation  Age of Onset          ?  Hypertension  Mother               Current Outpatient Medications          Medication  Sig  Dispense  Refill           ?  oxyCODONE IR (ROXICODONE) 5 mg immediate release tablet  Take 5 mg by mouth every six (6) hours as needed.               ?  tamsulosin (FLOMAX) 0.4 mg capsule  Take 1 Capsule by mouth daily.  30 Capsule  0           Review of Systems   Constitutional: Fever: No   Skin: Rash: No   HEENT: Hearing difficulty: No   Eyes: Blurred vision: No   Cardiovascular: Chest pain: No   Respiratory: Shortness of breath: No   Gastrointestinal: Nausea/vomiting: No   Musculoskeletal: Back pain: No   Neurological: Weakness: No   Psychological: Memory loss: No   Comments/additional findings:       PHYSICAL EXAMINATION:    Visit Vitals      Ht  6\' 1"  (1.854 m)     Wt  154 lb (69.9 kg)        BMI  20.32 kg/m??        Constitutional: WDWN, Pleasant and appropriate affect, No acute distress.     CV:  No peripheral swelling noted   Respiratory: No respiratory distress  or difficulties   Abdomen:  No abdominal masses or tenderness. No CVA tenderness. No inguinal hernias noted.    Skin: No jaundice.     Neuro/Psych:  Alert and oriented x 3, affect appropriate.    Lymphatic:   No enlarged inguinal lymph nodes.           LABS TODAY:       Recent Results (from the past 12 hour(s))     AMB POC URINALYSIS DIP STICK AUTO W/O MICRO          Collection Time: 10/20/20  1:59 PM         Result  Value  Ref Range            Color (UA POC)  Amber         Clarity (UA POC)  Clear         Glucose (UA POC)  Negative  Negative       Bilirubin (UA POC)  Negative  Negative       Ketones (UA POC)  Negative  Negative       Specific gravity (UA POC)  1.025  1.001 - 1.035       Blood (UA POC)  1+  Negative       pH (UA POC)  6.0  4.6 - 8.0       Protein (UA POC)  Negative  Negative       Urobilinogen (UA POC)  1 mg/dL  0.2 - 1       Nitrites (UA POC)  Negative  Negative            Leukocyte esterase (UA POC)  Trace  Negative            A copy of today's office visit with all pertinent imaging results and labs were sent to the referring physician.           Julien Girt, [place]    [facility]   [phone]              Encounter Diagnoses              ICD-10-CM  ICD-9-CM          1.  Kidney stone   N20.0  592.0

## 2020-12-28 ENCOUNTER — Inpatient Hospital Stay
Admit: 2020-12-28 | Discharge: 2020-12-28 | Disposition: A | Discharge status: Home / Self Care | Payer: BLUE CROSS/BLUE SHIELD | Attending: Emergency Medicine

## 2020-12-28 DIAGNOSIS — K0889 Other specified disorders of teeth and supporting structures: Secondary | ICD-10-CM

## 2020-12-28 MED ORDER — CHLORHEXIDINE GLUCONATE 0.12 % MOUTHWASH
0.12 % | Freq: Two times a day (BID) | 0 refills | Status: AC
Start: 2020-12-28 — End: 2021-01-11

## 2020-12-28 MED ORDER — PENICILLIN V-K 500 MG TAB
500 mg | ORAL_TABLET | Freq: Four times a day (QID) | ORAL | 0 refills | Status: AC
Start: 2020-12-28 — End: 2021-01-07

## 2020-12-28 NOTE — ED Notes (Signed)
 Patient states he has been having dental pain for about 4 days now.  Patient has had a filling done about 5 years ago on the upper left side.

## 2020-12-28 NOTE — ED Provider Notes (Signed)
EMERGENCY DEPARTMENT HISTORY AND PHYSICAL EXAM    8:42 AM      Date: 12/28/2020  Patient Name: Tim Peterson    History of Presenting Illness     Chief Complaint   Patient presents with    Dental Pain         History Provided By: Patient  Location/Duration/Severity/Modifying factors   50 year old gentleman presents with complaints of 4 days of worsening left upper molar dental pain.  States he had a filling done about 5 years ago, has had issues intermittently with his left upper molars, no recent known dental injury or fracture.  He has had pain with drinking fluids as well as chewing with Tim left side of his mouth, symptoms exacerbated over Tim last 4 days.  No swelling of Tim face, no fever.  Patient does not have a regular dentist.  Otherwise no other symptoms, no thoracic or abdominal pain, no difficulty breathing.  No URI symptoms.      Dental Pain         PCP: Verdon Cummins, DNP    Current Outpatient Medications   Medication Sig Dispense Refill    penicillin v potassium (VEETID) 500 mg tablet Take 1 Tablet by mouth four (4) times daily for 10 days. 40 Tablet 0    chlorhexidine (Peridex) 0.12 % solution 15 mL by Swish and Spit route every twelve (12) hours for 14 days. 420 mL 0    oxyCODONE IR (ROXICODONE) 5 mg immediate release tablet Take 5 mg by mouth every six (6) hours as needed. (Patient not taking: Reported on 12/28/2020)      tamsulosin (FLOMAX) 0.4 mg capsule Take 1 Capsule by mouth daily. (Patient not taking: Reported on 12/28/2020) 30 Capsule 0       Past History     Past Medical History:  Past Medical History:   Diagnosis Date    Elevated blood pressure reading     Hypertension     Marijuana smoker 02/24/2020    Multiple lipomas 02/24/2020    Nephrolithiasis     7/12, 4/17, 4/22, 6/22 bilat    Pain in joint of right shoulder     Rotator cuff syndrome of left shoulder 2020    Dr Guido Sander    TB (tuberculosis), treated     Weight loss 02/24/2020       Past Surgical History:  Past Surgical  History:   Procedure Laterality Date    HX ORTHOPAEDIC      left leg pins       Family History:  Family History   Problem Relation Age of Onset    Hypertension Mother        Social History:  Social History     Tobacco Use    Smoking status: Never    Smokeless tobacco: Never   Vaping Use    Vaping Use: Never used   Substance Use Topics    Alcohol use: Yes     Comment: occasionally    Drug use: Yes     Types: Marijuana     Comment: socially       Allergies:  No Known Allergies      Review of Systems       Review of Systems   Constitutional:  Negative for fever.   HENT:  Positive for dental problem. Negative for congestion and sore throat.    Eyes:  Negative for redness.   Respiratory:  Negative for shortness of breath.    Cardiovascular:  Negative for chest pain.   Gastrointestinal:  Negative for abdominal pain, diarrhea and vomiting.   Genitourinary:  Negative for dysuria.   Skin:  Negative for color change.   Allergic/Immunologic: Negative for immunocompromised state.   Neurological:  Negative for syncope.   Psychiatric/Behavioral:  Negative for confusion.        Physical Exam   Visit Vitals  BP (!) 144/107   Pulse 73   Temp 98.2 ??F (36.8 ??C)   Resp 18   Ht 6\' 1"  (1.854 m)   Wt 76.2 kg (168 lb)   SpO2 100%   BMI 22.16 kg/m??         Physical Exam  Nursing note and vitals reviewed.  General appearance: non-toxic, no distress  Eyes: PERRL, EOMI, conjunctiva normal, anicteric sclera  HEENT: mucous membranes moist, patient tender to palpation/percussion of tooth #16, somewhat over tooth #15.  Question erosion of Tim occlusal surface of tooth #16, no gingival swelling, buccal mucosa appears normal, soft palate normal.  Patient's had previous teeth extraction.  Pulmonary: Breath sounds present bilaterally, no crackles  Cardiac: normal rate and regular rhythm, no murmurs  Abdomen: soft, nontender, nondistended, not rigid  MSK: no deformity  Neuro: Alert, answers questions appropriately  Skin: capillary refill  brisk      Diagnostic Study Results     Labs -  No results found for this or any previous visit (from Tim past 12 hour(s)).    Radiologic Studies -   No orders to display         Medical Decision Making   I am Tim first provider for this patient.    I reviewed Tim vital signs, available nursing notes, past medical history, past surgical history, family history and social history.    Vital Signs-Reviewed Tim patient's vital signs.      EKG:     Records Reviewed: Nursing Notes (Time of Review: 8:42 AM)    ED Course: Progress Notes, Reevaluation, and Consults:         Provider Notes (Medical Decision Making):   MDM  Number of Diagnoses or Management Options  Elevated blood pressure reading  Pain, dental  Diagnosis management comments: 50 year old gentleman presents with left upper molar pain, questionable mild erosion of Tim occlusal surface, which reproduces Tim patient's pain symptoms.  There is no swelling of Tim gingiva or soft palate.  We will initiate therapy with penicillin VK, Peridex mouthwash, patient should continue anti-inflammatories at home.  Follow-up with his PCP to recheck elevated blood pressure reading.        Procedures    Critical Care Time: 0      Diagnosis     Clinical Impression:   1. Pain, dental    2. Elevated blood pressure reading        Disposition: discharge    Follow-up Information       Follow up With Specialties Details Why Contact Info    [facility] [address], Maryanna Shape Nurse Practitioner Schedule an appointment as soon as possible for a visit in 1 week Please have your primary care doctor recheck your blood pressure. [address] [place] [address] [place]  [address]  [place]  [phone]      [facility]  Schedule an appointment as soon as possible for a visit in 2 days Please call and schedule a follow-up appointment with [facility] or another dental provider of your choosing. [address]  [place]  [phone]  Patient's Medications    Start Taking    CHLORHEXIDINE (PERIDEX) 0.12 % SOLUTION    15 mL by Swish and Spit route every twelve (12) hours for 14 days.    PENICILLIN V POTASSIUM (VEETID) 500 MG TABLET    Take 1 Tablet by mouth four (4) times daily for 10 days.   Continue Taking    OXYCODONE IR (ROXICODONE) 5 MG IMMEDIATE RELEASE TABLET    Take 5 mg by mouth every six (6) hours as needed.    TAMSULOSIN (FLOMAX) 0.4 MG CAPSULE    Take 1 Capsule by mouth daily.   These Medications have changed    No medications on file   Stop Taking    No medications on file     Disclaimer: Sections of this note are dictated using utilizing voice recognition software.  Minor typographical errors may be present. If questions arise, please do not hesitate to contact me or call our department.

## 2020-12-28 NOTE — ED Notes (Signed)
Discharge instructions reviewed with patient.  Patient verbalized understanding.  Patient advised to follow up as directed on discharge instructions.  Patient denies questions, needs or concerns at this time.  Patient verbalized understanding. No s/sx of distress noted.

## 2021-02-01 ENCOUNTER — Ambulatory Visit: Payer: BLUE CROSS/BLUE SHIELD | Primary: Nurse Practitioner

## 2021-02-06 ENCOUNTER — Encounter: Attending: Medical | Primary: Nurse Practitioner

## 2021-02-12 ENCOUNTER — Inpatient Hospital Stay
Admit: 2021-02-12 | Discharge: 2021-02-12 | Disposition: A | Discharge status: Home / Self Care | Payer: BLUE CROSS/BLUE SHIELD | Attending: Emergency Medicine

## 2021-02-12 DIAGNOSIS — K0889 Other specified disorders of teeth and supporting structures: Secondary | ICD-10-CM

## 2021-02-12 MED ORDER — AMOXICILLIN 500 MG TABLET
500 mg | ORAL_TABLET | Freq: Three times a day (TID) | ORAL | 0 refills | Status: AC
Start: 2021-02-12 — End: 2021-02-19

## 2021-02-12 MED ORDER — NAPROXEN 500 MG TAB
500 mg | ORAL_TABLET | Freq: Two times a day (BID) | ORAL | 0 refills | Status: AC | PRN
Start: 2021-02-12 — End: 2021-02-22

## 2021-02-12 NOTE — ED Notes (Signed)
Tim Peterson is a 50 y.o. male that was discharged in stable condition.  The patients diagnosis, condition and treatment were explained to  patient and aftercare instructions were given.  The patient verbalized understanding. Patient armband removed and shredded.

## 2021-02-12 NOTE — ED Notes (Signed)
Patient c/o dental pain to top left side.  He states he has been seen in the past for the same tooth and pain has returned.  He did not follow up with a dentist.

## 2021-02-12 NOTE — ED Provider Notes (Signed)
ED Provider Notes by Jacqulynn Cadet, FNP at 02/12/21 1514                Author: Jacqulynn Cadet, FNP  Service: --  Author Type: Nurse Practitioner       Filed: 02/12/21 1517  Date of Service: 02/12/21 1514  Status: Attested           Editor: Jacqulynn Cadet, FNP (Nurse Practitioner)  Cosigner: Alver Sorrow, MD at 02/14/21 [phone]          Attestation signed by Alver Sorrow, MD at 02/14/21 [phone]          I was personally available for consultation in the emergency department.  I have reviewed the chart prior to the patient being discharged and agree with the  documentation recorded by the [facility], including the assessment, treatment plan, and disposition.   Alver Sorrow, MD                                  EMERGENCY DEPARTMENT HISTORY AND PHYSICAL EXAM      3:14 PM         Date: 02/12/2021   Patient Name: [facility]        History of Presenting Illness          Chief Complaint       Patient presents with        ?  Dental Pain              History Provided By: Patient      Additional History (Context): Jaxn Kulaga is a 50 y.o. male with past medical history significant for hypertension who presents with complaints  of left upper dental pain described as constant throbbing worse with chewing that started 2 to 3 days ago after Thanksgiving.  He denies any injury, trauma, or fall.  No facial swelling.  No fever or chills.  Has a known bad tooth and has not seen a dentist  in several years.  Prior filling to the tooth that is causing pain.  He took ibuprofen and an expired antibiotic at home today with some improvement.      PCP: Verdon Cummins, DNP        Current Outpatient Medications          Medication  Sig  Dispense  Refill           ?  amoxicillin (AMOXIL) 500 mg tablet  Take 1 Tablet by mouth three (3) times daily for 7 days.  21 Tablet  0           ?  naproxen (Naprosyn) 500 mg tablet  Take 1 Tablet by mouth two (2) times daily as needed for Pain for up to 10 days.  20  Tablet  0             Past History        Past Medical History:     Past Medical History:        Diagnosis  Date         ?  Elevated blood pressure reading       ?  Hypertension       ?  Marijuana smoker  02/24/2020     ?  Multiple lipomas  02/24/2020     ?  Nephrolithiasis            7/12,  4/17, 4/22, 6/22 bilat         ?  Pain in joint of right shoulder       ?  Rotator cuff syndrome of left shoulder  2020          Dr Guido Sander         ?  TB (tuberculosis), treated           ?  Weight loss  02/24/2020           Past Surgical History:     Past Surgical History:         Procedure  Laterality  Date          ?  HX ORTHOPAEDIC              left leg pins           Family History:     Family History         Problem  Relation  Age of Onset          ?  Hypertension  Mother             Social History:     Social History          Tobacco Use         ?  Smoking status:  Never     ?  Smokeless tobacco:  Never       Vaping Use         ?  Vaping Use:  Never used       Substance Use Topics         ?  Alcohol use:  Yes             Comment: occasionally         ?  Drug use:  Yes              Types:  Marijuana             Comment: socially           Allergies:   No Known Allergies           Review of Systems           Review of Systems    Constitutional: Negative.  Negative for chills and fever.    HENT:  Positive for dental problem. Negative for congestion, ear pain and rhinorrhea.     Eyes: Negative.  Negative for pain and redness.    Respiratory: Negative.  Negative for cough and shortness of breath.     Cardiovascular: Negative.  Negative for chest pain, palpitations and leg swelling.    Gastrointestinal: Negative.  Negative for abdominal pain, constipation, diarrhea, nausea and vomiting.    Genitourinary: Negative.  Negative for dysuria, frequency, hematuria and urgency.    Musculoskeletal: Negative.  Negative for back pain, gait problem, joint swelling and neck pain.    Skin: Negative.  Negative for rash and wound.    Neurological:  Negative.  Negative for dizziness, seizures, speech difficulty, weakness, light-headedness and headaches.    Hematological:  Negative for adenopathy. Does not bruise/bleed easily.    All other systems reviewed and are negative.           Physical Exam     Visit Vitals      BP  (!) 129/96 (BP 1 Location: Left upper arm)     Pulse  85     Temp  98.3 ??F (36.8 ??C)  Resp  16     Ht   (1.854 m)     Wt  77.1 kg (170 lb)     SpO2  96%        BMI  22.43 kg/m??              Physical Exam   Vitals and nursing note reviewed.    Constitutional:        General: He is not in acute distress.      Appearance: Normal appearance.    HENT:       Head: Normocephalic and atraumatic.       Jaw: There is normal jaw occlusion. No trismus.       Salivary Glands: Right salivary gland is not diffusely enlarged or tender. Left salivary gland is not diffusely enlarged or tender.       Right Ear: Tympanic membrane, ear canal and external ear normal.       Left Ear: Tympanic membrane, ear canal and external ear normal.       Nose: Nose normal.       Mouth/Throat:       Mouth: Mucous membranes are moist.       Dentition: Abnormal dentition. Does not have dentures. Dental tenderness  and dental caries present. No gingival swelling, dental abscesses or gum lesions.       Pharynx: Oropharynx is clear. Uvula midline. No pharyngeal swelling, oropharyngeal exudate, posterior oropharyngeal erythema or uvula swelling.       Tonsils: No tonsillar exudate or tonsillar abscesses. 0 on the right. 0  on the left.    Eyes:       Conjunctiva/sclera: Conjunctivae normal.       Pupils: Pupils are equal, round, and reactive to light.     Cardiovascular:       Rate and Rhythm: Normal rate and regular rhythm.    Pulmonary:       Effort: Pulmonary effort is normal.       Breath sounds: Normal breath sounds.     Musculoskeletal:       Cervical back: Normal range of motion and neck supple. No edema, erythema or rigidity. No muscular tenderness.      Lymphadenopathy:       Cervical: No cervical adenopathy.    Skin:      General: Skin is warm and dry.       Capillary Refill: Capillary refill takes less than 2 seconds.    Neurological:       General: No focal deficit present.       Mental Status: He is alert and oriented to person, place, and time.    Psychiatric:          Mood and Affect: Mood normal.          Behavior: Behavior normal.               Diagnostic Study Results        Labs -   No results found for this or any previous visit (from the past 12 hour(s)).      Radiologic Studies -      No orders to display                Medical Decision Making     I am the first provider for this patient.      I reviewed available nursing notes, past medical history, past surgical history, family history and social history.  Vital Signs-Reviewed the patient's vital signs.      Records Reviewed: Nursing Notes and Old Medical Records (Time of Review: 3:14 PM)      Pulse Oximetry Analysis - 96% on RA- normal       Cardiac Monitor:   Rate:    Rhythm:      EKG:      ED Course: Progress Notes, Reevaluation, and Consults:   3:14 PM  Initial assessment performed. The patients presenting problems have been discussed, and they/their family are in agreement with the care plan formulated and outlined with them. I have encouraged  them to ask questions as they arise throughout their visit.      Provider Notes (Medical Decision Making):       Differential diagnosis includes: Periapical abscess, pulpitis, odontogenic infection, sinusitis, trauma, alveolar osteitis, necrotizing gingivitis, retropharyngeal abscess, peritonsillar abscess .       Patient presents ambulatory, no acute distress.  Patient is well-hydrated and nontoxic in appearance.  Exam reveals dental pain over tooth #14.  There is appropriate tenderness on palpation.  No localized  induration or fluctuance to suggest drainable abscess. No sublingual/submandibular induration, trismus, or stridor.  Floor of mouth is  soft.  Nasolabial folds are soft with no facial swelling.  Normal speech.  Handling oral secretions without difficulty.   Patient has full range of motion of the neck. Low suspicion for Ludwig's angina or deep space infection.  Will discharge home with antibiotics and medication for pain.  Patient is advised to follow-up with a dentist/oral surgeon to further manage this  condition.               Diagnosis        Clinical Impression:       1.  Dentalgia            Disposition: Discharged home in stable condition      DISCHARGE NOTE:       Patient has been reexamined. Patient has no new complaints, changes, or physical findings.  Care plan outlined and precautions discussed.  Results of physical exam findings were reviewed with the patient. All medications were reviewed with the patient;  will discharge home with amoxicillin and naproxen. All of patient's questions and concerns were addressed. Patient was instructed and agrees to follow up with dentist/oral surgery, as well as to return to the ED upon further deterioration. Patient is  ready to go home.        Follow-up Information                  Follow up With  Specialties  Details  Why  Contact Info              [facility]    Schedule an appointment as soon as possible for a visit   Follow-up from the Emergency Department  [address]   [place] [address]   [phone]              [facility]  Emergency Medicine    As needed, If symptoms worsen  [address] [place] [address]   [phone]                        Current Discharge Medication List                 START taking these medications          Details  amoxicillin (AMOXIL) 500 mg tablet  Take 1 Tablet by mouth three (3) times daily for 7 days.   Qty: 21 Tablet, Refills: 0   Start date: 02/12/2021, End date: 02/19/2021               naproxen (Naprosyn) 500 mg tablet  Take 1 Tablet by mouth two (2) times daily as needed for Pain for up to 10 days.    Qty: 20 Tablet, Refills: 0   Start date: 02/12/2021, End date: 02/22/2021                            Dictation disclaimer:  Please note that this dictation was completed with Dragon, the computer voice recognition software.  Quite often unanticipated grammatical, syntax, homophones, and other interpretive  errors are inadvertently transcribed by the computer software.  Please disregard these errors.  Please excuse any errors that have escaped final proofreading.

## 2021-02-15 ENCOUNTER — Ambulatory Visit: Payer: BLUE CROSS/BLUE SHIELD | Attending: Nurse Practitioner | Primary: Nurse Practitioner

## 2021-06-01 ENCOUNTER — Ambulatory Visit: Admit: 2021-06-01 | Discharge: 2021-06-01 | Payer: MEDICARE | Attending: Internal Medicine | Primary: Nurse Practitioner

## 2021-06-01 DIAGNOSIS — M5412 Radiculopathy, cervical region: Secondary | ICD-10-CM

## 2021-06-01 MED ORDER — METHYLPREDNISOLONE 4 MG PO TBPK
4 MG | PACK | ORAL | 0 refills | Status: DC
Start: 2021-06-01 — End: 2021-09-11

## 2021-06-01 NOTE — Telephone Encounter (Signed)
error 

## 2021-06-01 NOTE — Progress Notes (Signed)
Shoulder Pain   Pertinent negatives include no fever or numbness.    Tim Peterson is here with several months of left shoulder pain.  He has always had some issues with his left shoulder, but much more severe recently.  He works as a Dealer.  It hurts to sleep on his shoulder at night, and hurts despite Tylenol, NSAIDs, and topical rubs massage to the area.  He had an MRI in 2020 which showed some subacromial bursitis/fluid, as well as some tendinosis of some of the rotator cuff with fraying, but no significant tears.  It is worse with certain positions.      Past Medical History:   Diagnosis Date    Elevated blood pressure reading     Hypertension     Marijuana smoker 02/24/2020    Multiple lipomas 02/24/2020    Nephrolithiasis     7/12, 4/17, 4/22, 6/22 bilat    Pain in joint of right shoulder     Rotator cuff syndrome of left shoulder 2020    Dr Guilford Shi    TB (tuberculosis), treated     Weight loss 02/24/2020        Past Surgical History:   Procedure Laterality Date    ORTHOPEDIC SURGERY      left leg pins        Current Outpatient Medications   Medication Sig Dispense Refill    methylPREDNISolone (MEDROL DOSEPACK) 4 MG tablet Take early in day with food, no NSAIDs while taking 1 kit 0     No current facility-administered medications for this visit.        No Known Allergies     Social History     Socioeconomic History    Marital status: Single     Spouse name: Not on file    Number of children: Not on file    Years of education: Not on file    Highest education level: Not on file   Occupational History    Not on file   Tobacco Use    Smoking status: Never    Smokeless tobacco: Never   Substance and Sexual Activity    Alcohol use: Yes    Drug use: Yes     Types: Marijuana Tim Peterson)    Sexual activity: Not on file   Other Topics Concern    Not on file   Social History Narrative    ** Merged History Encounter **          Social Determinants of Health     Financial Resource Strain: Not on file   Food Insecurity: Not  on file   Transportation Needs: Not on file   Physical Activity: Not on file   Stress: Not on file   Social Connections: Not on file   Intimate Partner Violence: Not on file   Housing Stability: Not on file        Family History   Problem Relation Age of Onset    Hypertension Mother            BP (!) 129/90    Pulse 90    Temp 98.2 ??F (36.8 ??C) (Temporal)    Resp 18    Ht 6' 1"  (1.854 m)    Wt 159 lb (72.1 kg)    SpO2 99%    BMI 20.98 kg/m??      Review of Systems   Constitutional:  Negative for chills and fever.        No night sweats.  Cardiovascular:  Negative for chest pain.        No orthopnea, no PND   Musculoskeletal:  Positive for neck pain.   Neurological:  Negative for numbness.      Physical Exam  Constitutional:       General: He is not in acute distress.     Comments: , Well-developed, well-nourished thin   Eyes:      General: No scleral icterus.     Conjunctiva/sclera: Conjunctivae normal.   Neck:      Comments: No cervical or supraclavicular lymphadenopathy.  Cardiovascular:      Rate and Rhythm: Normal rate.   Pulmonary:      Effort: Pulmonary effort is normal.      Breath sounds: Normal breath sounds.   Abdominal:      General: Abdomen is flat.   Musculoskeletal:      Comments: Left upper extremity no cords, nontender to palpation of arm.  Lightly decreased left brachioradialis reflex.  No pain with left upper extremity abduction greater than 90 degrees, negative drop test.  Negative impingement sign.  Pain is worsened with neck extension, and with head abduction to the left, pain in left shoulder is decreased with head abduction to the right.   Skin:     General: Skin is warm and dry.   Neurological:      Mental Status: He is alert and oriented to person, place, and time.   Psychiatric:         Mood and Affect: Mood normal.                Tim Peterson was seen today for shoulder pain.    Diagnoses and all orders for this visit:    Left cervical radiculopathy  Comments:  MRI 2020 showed tendinosis  ,bursitis,rotator cuff fraying, today's exam suggests cervical radiculopathy.Medrol pack w/precautions, call if persists/recurs  Orders:  -     methylPREDNISolone (MEDROL DOSEPACK) 4 MG tablet; Take early in day with food, no NSAIDs while taking         No follow-up provider specified.         Berline Chough, MD

## 2021-06-01 NOTE — Progress Notes (Signed)
Tim Peterson presents today for No chief complaint on file.                1. "Have you been to the ER, urgent care clinic since your last visit?  Hospitalized since your last visit?" no    2. "Have you seen or consulted any other health care providers outside of the [facility] since your last visit?" no     3. For patients aged 49-75: Has the patient had a colonoscopy / FIT/ Cologuard? Yes - no Care Gap present      If the patient is male:    4. For patients aged 72-74: Has the patient had a mammogram within the past 2 years? NA - based on age or sex      61. For patients aged 21-65: Has the patient had a pap smear? NA - based on age or sex

## 2021-06-04 NOTE — Telephone Encounter (Addendum)
Patient called in stating that he was in here on Friday and seen Dr.Sandved for shoulder pain.He has been taking the medicine that was prescribed for 2 days but its been making him dizzy, he's not sure if that is one of the side affects.    He stated that if he was still having shoulder pain that the dr would extend is work note.Please Advise    Patient can be reach [phone]

## 2021-06-04 NOTE — Telephone Encounter (Signed)
Work note extended. Patient will pick up letter from office today.

## 2021-06-04 NOTE — Telephone Encounter (Signed)
Please extend work note to include off work today

## 2021-06-04 NOTE — Telephone Encounter (Signed)
Pt calling to check status of message below.    Stated he needs work note extension to include today.

## 2021-08-06 ENCOUNTER — Encounter: Payer: MEDICARE | Attending: Nurse Practitioner | Primary: Nurse Practitioner

## 2021-09-11 ENCOUNTER — Encounter

## 2021-09-11 ENCOUNTER — Ambulatory Visit
Admit: 2021-09-11 | Discharge: 2021-09-11 | Payer: MEDICARE | Attending: Nurse Practitioner | Primary: Nurse Practitioner

## 2021-09-11 DIAGNOSIS — N200 Calculus of kidney: Secondary | ICD-10-CM

## 2021-09-11 MED ORDER — TAMSULOSIN HCL 0.4 MG PO CAPS
0.4 MG | ORAL_CAPSULE | Freq: Every day | ORAL | 1 refills | Status: AC
Start: 2021-09-11 — End: 2022-03-19

## 2021-09-11 NOTE — Progress Notes (Signed)
[facility]  [address] [name]  [address]  [place], [place] [address]  [phone] fax    09/11/2021    Tim Peterson Mar 30, 1970 is a pleasant Black / African American male.     Todays concern/HPI:  Sunday night started with familiar pain on Rt flank. Pain is sharp, stabbing. Monday morning before work he believes he passed a stone. He noted a solid mass covered with blood in the toilet. Stones is a reoccurring issue. When taking flomax he dont experience stone issues. He denies pain today. Has not seen urologist in awhile but will schedule appt.     Denies n/v, appetite intact.     Requesting letter for work to return tomorrow.    Review of Systems - Negative except above    Past Medical History:   Diagnosis Date    Elevated blood pressure reading     Hypertension     Marijuana smoker 02/24/2020    Multiple lipomas 02/24/2020    Nephrolithiasis     7/12, 4/17, 4/22, 6/22 bilat    Pain in joint of right shoulder     Rotator cuff syndrome of left shoulder 2020    Dr Guido Sander    TB (tuberculosis), treated     Weight loss 02/24/2020     Current Outpatient Medications   Medication Sig    tamsulosin (FLOMAX) 0.4 MG capsule Take 1 capsule by mouth daily     No current facility-administered medications for this visit.       Physical:   BP 117/89 (Site: Left Upper Arm, Position: Sitting, Cuff Size: Medium Adult)   Pulse 77   Temp 98.2 F (36.8 C) (Temporal)   Resp 20   Ht 6\' 1"  (1.854 m)   Wt 154 lb (69.9 kg)   SpO2 98%   BMI 20.32 kg/m     Exam:   Physical Exam  Constitutional:       Appearance: Normal appearance.   Eyes:      Extraocular Movements: Extraocular movements intact.      Conjunctiva/sclera: Conjunctivae normal.   Cardiovascular:      Rate and Rhythm: Normal rate and regular rhythm.      Heart sounds: Normal heart sounds.   Pulmonary:      Effort: Pulmonary effort is normal.      Breath sounds: Normal breath sounds.   Musculoskeletal:         General: Normal  range of motion.   Neurological:      Mental Status: He is alert and oriented to person, place, and time.   Psychiatric:         Mood and Affect: Mood normal.       Body mass index is 20.32 kg/m.       Plan:    Pt last seen in office with me 02/2020    1. Nephrolithiasis  Assessment & Plan:  Pain to Rt flank  Initiate flomax  Increase water intake  Foods to consume:  Vegetables, fruits, and whole grains.  Fat-free or low-fat dairy products.  Fish, 03/2020.  Beans, nuts, and vegetable oils.  Foods low in saturated and trans fats.  Foods rich in potassium, calcium, magnesium, fiber, and protein.  Foods lower in sodium.    Follow up with urology  Placed order for Environmental education officer    Orders:  -     Urinalysis; Future  -     tamsulosin (FLOMAX) 0.4 MG capsule; Take 1 capsule by mouth daily,  Disp-90 capsule, R-1Normal  -     [place] RETROPERITONEAL COMPLETE; Future      Reviewed medication and completed medication reconciliation with the patient. Reviewed side effects of medications with the patient. Questions were answered and patient verb understanding.    Past Surgical History:   Procedure Laterality Date    ORTHOPEDIC SURGERY      left leg pins     Allergies and Intolerances:   No Known Allergies  Family History:   Family History   Problem Relation Age of Onset    Hypertension Mother      Social History:   He  reports that he has never smoked. He has never used smokeless tobacco.   Social History     Substance and Sexual Activity   Alcohol Use Yes     Immunization History:  Immunization History   Administered Date(s) Administered    COVID-19, MODERNA BLUE border, Primary or Immunocompromised, (age 12y+), IM, 100 mcg/0.81mL 10/21/2019, 11/18/2019         Return in about 6 months (around 03/13/2022) for Follow up, Lab fasting (PSA, lipid, CMP, CBC).        Dr. Essie Christine, AGNP-C, DNP  [facility]

## 2021-09-11 NOTE — Progress Notes (Addendum)
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 "Have you been to the ER, urgent care clinic since your last visit?  Hospitalized since your last visit?" No    2. "Have you seen or consulted any other health care providers outside of the [facility] since your last visit?" No     3. For patients aged 64-75: Has the patient had a colonoscopy / FIT/ Cologuard? Yes - no Care Gap present      I

## 2021-09-11 NOTE — Assessment & Plan Note (Signed)
Pain to Rt flank  Initiate flomax  Increase water intake  Foods to consume:  Vegetables, fruits, and whole grains.  Fat-free or low-fat dairy products.  Fish, Environmental education officer.  Beans, nuts, and vegetable oils.  Foods low in saturated and trans fats.  Foods rich in potassium, calcium, magnesium, fiber, and protein.  Foods lower in sodium.    Follow up with urology  Placed order for [place]

## 2021-09-12 LAB — URINALYSIS
Bilirubin Urine: NEGATIVE
Blood, Urine: NEGATIVE
Glucose, Ur: NEGATIVE
Nitrite, Urine: NEGATIVE
Specific Gravity, UA: 1.024 (ref 1.005–1.030)
Urobilinogen, Urine: 1 mg/dL (ref 0.2–1.0)
pH, UA: 6.5 (ref 5.0–7.5)

## 2021-12-04 ENCOUNTER — Ambulatory Visit: Payer: MEDICARE | Attending: Nurse Practitioner | Primary: Nurse Practitioner

## 2022-01-14 ENCOUNTER — Inpatient Hospital Stay
Admit: 2022-01-14 | Discharge: 2022-01-14 | Disposition: A | Discharge status: Home / Self Care | Payer: MEDICARE | Attending: Emergency Medicine

## 2022-01-14 ENCOUNTER — Emergency Department: Admit: 2022-01-14 | Payer: MEDICARE | Primary: Nurse Practitioner

## 2022-01-14 DIAGNOSIS — S39012A Strain of muscle, fascia and tendon of lower back, initial encounter: Secondary | ICD-10-CM

## 2022-01-14 MED ORDER — KETOROLAC TROMETHAMINE 15 MG/ML IJ SOLN
15 MG/ML | Freq: Once | INTRAMUSCULAR | Status: AC
Start: 2022-01-14 — End: 2022-01-14
  Administered 2022-01-14: 20:00:00 15 mg via INTRAMUSCULAR

## 2022-01-14 MED ORDER — LIDOCAINE 4 % EX PTCH
4 % | CUTANEOUS | Status: DC
Start: 2022-01-14 — End: 2022-01-14
  Administered 2022-01-14: 20:00:00 1 via TRANSDERMAL

## 2022-01-14 MED ORDER — ACETAMINOPHEN 500 MG PO TABS
500 MG | Freq: Once | ORAL | Status: AC
Start: 2022-01-14 — End: 2022-01-14
  Administered 2022-01-14: 20:00:00 1000 mg via ORAL

## 2022-01-14 MED FILL — ACETAMINOPHEN EXTRA STRENGTH 500 MG PO TABS: 500 MG | ORAL | Qty: 2

## 2022-01-14 MED FILL — KETOROLAC TROMETHAMINE 15 MG/ML IJ SOLN: 15 MG/ML | INTRAMUSCULAR | Qty: 1

## 2022-01-14 MED FILL — LIDOCAINE PAIN RELIEF 4 % EX PTCH: 4 % | CUTANEOUS | Qty: 1

## 2022-01-14 NOTE — ED Triage Notes (Signed)
Pt to ED for eval of LBP that started while mowing grass with a push mower yesterday. Pt denies saddle numbness and loss of B/B control. Pt unable to ambulate.

## 2022-01-14 NOTE — ED Provider Notes (Signed)
EMERGENCY DEPARTMENT HISTORY AND PHYSICAL EXAM        Date: 01/14/2022  Patient Name: Tim [facility]    History of Presenting Illness     Chief Complaint   Patient presents with    Back Pain       History Provided By: History obtained from patient    HPI: Tim [facility], 51 y.o. male presents to the ED with cc of low back pain x 2d    Patient states that he has had back pain on and off for the last 30 days.  3 days ago he used heat pack and successfully relieved back pain the following morning he began to push mow the yard and experienced back pain.  He rested around the house but then went to work today hit a bump in the truck and had a sharp pain in his back.  He left work to be treated at the emergency department.  He denies any numbness or tingling in the legs, no loss of bowel or bladder control.    Patient denies alcohol abuse, history of diabetes, history of renal failure, difficult night pain, patient has not been to the [facility] for this in the last 20 days.  Patient denies IV drug use, fever, recent systemic infection, immunosuppressive medications, no spinal procedures, no incontinence or retention of urine, no recent Foley catheter.  Denies numbness or tingling in saddle region or lower extremities, no weakness or motor deficit in lower extremities.    Patient has extensive history of kidney stones and states that this pain does not feel like kidney stone pain.  He sees a outpatient specialist and does not want to be evaluated for a possible kidney stone today.    No nausea, vomiting, diarrhea, fever, chills, chest pain, shortness of breath, leg swelling     There are no other complaints, changes, or physical findings at this time.    Records Reviewed: chart overview    PCP: Verdon Cummins, DNP    No current facility-administered medications on file prior to encounter.     Current Outpatient Medications on File Prior to Encounter   Medication Sig Dispense Refill    tamsulosin (FLOMAX) 0.4 MG  capsule Take 1 capsule by mouth daily 90 capsule 1           Past History     Past Medical History:  Past Medical History:   Diagnosis Date    Elevated blood pressure reading     Hypertension     Marijuana smoker 02/24/2020    Multiple lipomas 02/24/2020    Nephrolithiasis     7/12, 4/17, 4/22, 6/22 bilat    Pain in joint of right shoulder     Rotator cuff syndrome of left shoulder 2020    Dr Guido Sander    TB (tuberculosis), treated     Weight loss 02/24/2020       Past Surgical History:  Past Surgical History:   Procedure Laterality Date    ORTHOPEDIC SURGERY      left leg pins       Family History:  Family History   Problem Relation Age of Onset    Hypertension Mother        Social History:  Social History     Tobacco Use    Smoking status: Never    Smokeless tobacco: Never   Substance Use Topics    Alcohol use: Yes    Drug use: Yes     Types: Marijuana Sheran Fava)  Allergies:  No Known Allergies      Review of Systems   Review of Systems   Constitutional:  Negative for appetite change, fatigue and fever.   Respiratory:  Negative for cough, chest tightness and shortness of breath.    Cardiovascular:  Negative for chest pain, palpitations and leg swelling.   Gastrointestinal:  Negative for abdominal pain, diarrhea, nausea and vomiting.   Genitourinary:  Negative for difficulty urinating, dysuria, flank pain and frequency.   Musculoskeletal:  Positive for back pain. Negative for arthralgias, gait problem, joint swelling, myalgias, neck pain and neck stiffness.   Skin:  Negative for rash.   Neurological:  Negative for dizziness, facial asymmetry, weakness, light-headedness, numbness and headaches.   Hematological:  Negative for adenopathy. Does not bruise/bleed easily.   Psychiatric/Behavioral:  Negative for agitation, behavioral problems and confusion.          Physical Exam   Physical Exam  Vitals and nursing note reviewed.   Constitutional:       General: He is not in acute distress.     Appearance: Normal appearance.  He is not diaphoretic.   HENT:      Head: Normocephalic and atraumatic.   Cardiovascular:      Rate and Rhythm: Normal rate and regular rhythm.   Pulmonary:      Effort: Pulmonary effort is normal.      Breath sounds: Normal breath sounds.   Abdominal:      General: Abdomen is flat. There is no distension.      Palpations: Abdomen is soft.      Tenderness: There is no abdominal tenderness. There is no right CVA tenderness, left CVA tenderness, guarding or rebound.   Musculoskeletal:      Cervical back: Normal, normal range of motion and neck supple.      Thoracic back: Normal.      Comments: No saddle anesthesia, negative SLR b/l, full ROM of hip and knee, abnormal gait: antalgic, he is able to take several steps out of the room but utilizes his wife's cane for ambulation to the bathroom.   Skin:     General: Skin is warm and dry.   Neurological:      Mental Status: He is alert.        Vitals:    01/14/22 1353   BP: (!) 150/104   Pulse: 75   Resp: 16   Temp: 98.4 F (36.9 C)   SpO2: 100%         Diagnostic Study Results     Labs -   No results found for this or any previous visit (from the past 12 hour(s)).      Radiologic Studies -   Non-plain film images such as CT, Ultrasound and MRI are read by the radiologist. Plain radiographic images are visualized and preliminarily interpreted by me with findings noted in MDM section. In evenings and at night radiology interpretations are not routinely available.     Interpretation per the Radiologist below, if available at the time of this note:    XR LUMBAR SPINE (2-3 VIEWS)   Final Result   DDD L4-L5 with additional findings as described.            SCREENINGS                 Medical Decision Making   I am the first provider for this patient.    I reviewed the vital signs, available nursing notes, past medical history,  past surgical history, family history and social history.      Provider Notes (Medical Decision Making):   Back pain: This patient presents with back pain  most consistent with lumbar strain. Differential diagnoses includes lumbago versus musculoskeletal spasm / strain versus sciatica. Less likely sciatica as straight leg raise test was negative. No back pain red flags on history or physical per CMT below. Presentation not consistent with malignancy (lack of history of malignancy, lack of B symptoms of lymphoma: fever, weight loss, night sweats), fracture (no trauma, no bony tenderness to palpation), cauda equina (no bowel or urinary incontinence/retention, no saddle anesthesia, no distal weakness), AAA, viscus perforation, osteomyelitis or epidural abscess (no IV drug use, vertebral tenderness, immunocompromised, recent spinal procedures: see CMT below), renal colic, pyelonephritis (afebrile, no CVA tenderness, no urinary symptoms). Given the clinical picture, no indication for imaging at this time.    I utilized an evidence-based risk rating tool (CMT) along with my training and experience to weigh the risk of discharge against the risks of further testing, imaging, or hospitalization. At this time I estimate the risks of additional testing, imaging, or hospitalization to be equal to or greater than the risk of discharge. I discussed my risk assessment with the patient and the patient consents to the risk of discharge as well as the risk of uncertainty in estimating outcomes.  Given the symptoms and findings present at this time, the chance of SEA or SCC is so remote that additional testing or imaging is more likely to harm the patient than diagnose SEA. [id number]        ED Course:        1. Strain of lumbar region, initial encounter        Orders Placed This Encounter   Medications    ketorolac (TORADOL) injection 15 mg    lidocaine 4 % external patch 1 patch    acetaminophen (TYLENOL) tablet 1,000 mg          Medication List        ASK your doctor about these medications      tamsulosin 0.4 MG capsule  Commonly known as: FLOMAX  Take 1 capsule by mouth  daily              Follow-ups:  Verdon Cummins, DNP  [address] [facility] [place]  [address]  [place] [address]  [phone]      If symptoms worsen, As needed    [facility]  [address] [place] [address]  [phone]    If symptoms worsen            Joseph Pierini  01/14/22 1623

## 2022-01-14 NOTE — ED Notes (Signed)
Discharge instructions reviewed with patient. Patient advised to follow up as directed on discharge instructions.  Patient denies questions, needs or concerns at this time.  Patient verbalized understanding. No s/sx of distress noted.        Melburn Popper, RN  01/14/22 [phone]

## 2022-01-14 NOTE — Discharge Instructions (Signed)
Helpful therapies for low back pain include heat pads, massage, topical pain relievers such as BenGay, and maintaining your mobility. Do not rest in bed until the pain goes away, you will have to ambulate to restore functional status to the back and only lie in bed if pain is intolerable or new symptoms develop.Take over the counter anti-inflammatory and pain medicine as needed during back strain.    Return to ED if: neurological deficit in legs or saddle region (numb, tingling, weak), incontinence or urinary retention, abrupt increases in pain, or unresolved pain.

## 2022-01-15 ENCOUNTER — Ambulatory Visit: Admit: 2022-01-15 | Discharge: 2022-01-15 | Payer: MEDICARE | Attending: Internal Medicine | Primary: Nurse Practitioner

## 2022-01-15 DIAGNOSIS — M545 Low back pain, unspecified: Secondary | ICD-10-CM

## 2022-01-15 MED ORDER — OXYCODONE-ACETAMINOPHEN 7.5-325 MG PO TABS
ORAL_TABLET | Freq: Four times a day (QID) | ORAL | 0 refills | Status: AC | PRN
Start: 2022-01-15 — End: 2022-01-22

## 2022-01-15 MED ORDER — PREDNISONE 10 MG (21) PO TBPK
10 MG (21) | ORAL | 0 refills | Status: DC
Start: 2022-01-15 — End: 2022-03-19

## 2022-01-15 MED ORDER — TIZANIDINE HCL 4 MG PO TABS
4 MG | ORAL_TABLET | Freq: Three times a day (TID) | ORAL | 0 refills | Status: DC | PRN
Start: 2022-01-15 — End: 2022-03-19

## 2022-01-15 NOTE — Progress Notes (Signed)
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 Pt states he cant walk standing straight iup. Pt was seen in ER ) and Follow-up         ASSESSMENT/PLAN:    ICD-10-CM    1. Acute midline low back pain without sciatica  M54.50 predniSONE 10 MG (21) TBPK     tiZANidine (ZANAFLEX) 4 MG tablet     oxyCODONE-acetaminophen (PERCOCET) 7.5-325 MG per tablet             Return in about 3 days (around 01/18/2022) for ok to double book.         Subjective   SUBJECTIVE/OBJECTIVE:  Back Pain  Patient presents for evaluation of low back problems. Symptoms have been present for 2 days and include pain in lower back (sharp and constant in character; 8/10 in severity). Initial inciting event:  Initially started when patient was mowing his lawn and worsened the next day when he tried to put on his work boots while bending over . Symptoms are worse in the morning. Alleviating factors identifiable by the patient are bending forwards. Aggravating factors identifiable by the patient are recumbency, standing, and walking. Treatments initiated by the patient:  Patient went to the emergency room yesterday and was given Toradol and a Lidoderm patch which has not caused any significant improvement . Previous lower back problems:  Patient about 2 months ago while helping someone moved injured his back but did not have any significant evaluation at that time . Previous work up:  X-ray lumbosacral spine was done in emergency room yesterday shows mild degenerative joint disease at L5 . Previous treatments:  As above. Marland Kitchen    Respiratory ROS: negative for - shortness of breath  Cardiovascular ROS: negative for - chest pain       Objective   BP 128/82 (Site: Right Upper Arm, Position: Sitting, Cuff Size: Medium Adult)   Pulse 84   Temp 98.6 F (37 C)  (Temporal)   Resp 18   Ht 1.854 m (6\' 1" )   Wt 70.8 kg (156 lb)   SpO2 99%   BMI 20.58 kg/m   Back exam - antalgic gait, limited range of motion, pain with motion noted during exam, tenderness noted paraspinal muscles in the lumbar area, negative straight-leg raise bilaterally        Current Outpatient Medications   Medication Sig    predniSONE 10 MG (21) TBPK Use as directed    tiZANidine (ZANAFLEX) 4 MG tablet Take 1 tablet by mouth every 8 hours as needed (back pain)    oxyCODONE-acetaminophen (PERCOCET) 7.5-325 MG per tablet Take 1 tablet by mouth every 6 hours as needed for Pain for up to 7 days. Intended supply: 30 days Max Daily Amount: 4 tablets    tamsulosin (FLOMAX) 0.4 MG capsule Take 1 capsule by mouth daily     No current facility-administered medications for this visit.                 An electronic signature was used to authenticate this note.    --Hser Belanger Vear Clock, MD

## 2022-01-18 ENCOUNTER — Ambulatory Visit: Admit: 2022-01-18 | Discharge: 2022-01-18 | Payer: MEDICARE | Attending: Internal Medicine | Primary: Nurse Practitioner

## 2022-01-18 DIAGNOSIS — M545 Low back pain, unspecified: Secondary | ICD-10-CM

## 2022-01-18 NOTE — Progress Notes (Signed)
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 Acute midline low back pain without sciatica  M54.50 Improved with prednisone and muscle relaxer. Pt to RTW on 11/6 but if he has any recurrence would consider PT.             Return if symptoms worsen or fail to improve.         Subjective   SUBJECTIVE/OBJECTIVE:  Pt's back pain is improving with prednisone and Zanaflex. He rarely uses Percocet. Pt will RTW on 01/21/22 but if he has any recurrent back pain should consider PT>            Objective   BP 125/85 (Site: Left Upper Arm, Position: Sitting, Cuff Size: Large Adult)   Pulse 62   Temp 97.9 F (36.6 C) (Temporal)   Resp 16   Ht 1.854 m (6\' 1" )   Wt 71.7 kg (158 lb)   SpO2 98%   BMI 20.85 kg/m   Back exam - improved ROM and decreased pain with movement        Current Outpatient Medications   Medication Sig    predniSONE 10 MG (21) TBPK Use as directed    tiZANidine (ZANAFLEX) 4 MG tablet Take 1 tablet by mouth every 8 hours as needed (back pain)    oxyCODONE-acetaminophen (PERCOCET) 7.5-325 MG per tablet Take 1 tablet by mouth every 6 hours as needed for Pain for up to 7 days. Intended supply: 30 days Max Daily Amount: 4 tablets    tamsulosin (FLOMAX) 0.4 MG capsule Take 1 capsule by mouth daily     No current facility-administered medications for this visit.                 An electronic signature was used to authenticate this note.    --Darby Fleeman Vear Clock, MD

## 2022-01-18 NOTE — Progress Notes (Signed)
Tim [facility] [employer] presents today for   Chief Complaint   Patient presents with    Back Pain     3 day follow up                  1. "Have you been to the ER, urgent care clinic since your last visit?  Hospitalized since your last visit?" no    2. "Have you seen or consulted any other health care providers outside of the [facility] since your last visit?" no     3. For patients aged 49-75: Has the patient had a colonoscopy / FIT/ Cologuard? Yes - no Care Gap present      If the patient is male:    4. For patients aged 54-74: Has the patient had a mammogram within the past 2 years? NA - based on age or sex      78. For patients aged 21-65: Has the patient had a pap smear? NA - based on age or sex

## 2022-03-06 ENCOUNTER — Telehealth

## 2022-03-06 NOTE — Telephone Encounter (Signed)
Please add orders for labs, patient scheduled next week.      Return in about 6 months (around 03/13/2022) for Follow up, Lab fasting (PSA, lipid, CMP, CBC).

## 2022-03-06 NOTE — Telephone Encounter (Signed)
Diagnosis Orders   1. Iron deficiency anemia, unspecified iron deficiency anemia type  CBC with Auto Differential      2. Hypokalemia  Comprehensive Metabolic Panel

## 2022-03-13 ENCOUNTER — Ambulatory Visit: Payer: MEDICARE | Primary: Nurse Practitioner

## 2022-03-19 ENCOUNTER — Ambulatory Visit
Admit: 2022-03-19 | Discharge: 2022-03-19 | Payer: MEDICARE | Attending: Nurse Practitioner | Primary: Nurse Practitioner

## 2022-03-19 DIAGNOSIS — J3089 Other allergic rhinitis: Secondary | ICD-10-CM

## 2022-03-19 NOTE — Progress Notes (Signed)
[facility]  [place], [place]  [phone] fax    03/19/2022    Tim [facility] 1971/03/03 is a pleasant Black / African American male.     Todays concern/HPI:  Headaches. May take excedrine at times for H/A. Has allergies as well. Works in dusty areas at times. Not taking OTC allergy meds.      Review of Systems - Negative except above    Physical:   BP 122/72 (Site: Left Upper Arm, Position: Sitting, Cuff Size: Medium Adult)   Pulse 72   Temp 99.5 F (37.5 C) (Temporal)   Resp 16   Ht 1.854 m (6\' 1" )   Wt 73.4 kg (161 lb 12.8 oz)   SpO2 98%   BMI 21.35 kg/m     Exam:   Physical Exam  Constitutional:       Appearance: Normal appearance.   HENT:      Head: Normocephalic and atraumatic.      Right Ear: Tympanic membrane, ear canal and external ear normal.      Left Ear: Tympanic membrane, ear canal and external ear normal.      Nose: Nose normal.      Mouth/Throat:      Mouth: Mucous membranes are moist.   Eyes:      Extraocular Movements: Extraocular movements intact.      Conjunctiva/sclera: Conjunctivae normal.   Neck:      Vascular: No carotid bruit.   Cardiovascular:      Rate and Rhythm: Normal rate and regular rhythm.      Pulses: Normal pulses.      Heart sounds: Normal heart sounds.   Pulmonary:      Effort: Pulmonary effort is normal. No respiratory distress.      Breath sounds: Normal breath sounds. No wheezing.   Abdominal:      General: Abdomen is flat. Bowel sounds are normal. There is no distension.      Palpations: Abdomen is soft.      Tenderness: There is no abdominal tenderness.   Musculoskeletal:         General: Normal range of motion.      Cervical back: Normal range of motion and neck supple.   Skin:     General: Skin is warm and dry.   Neurological:      Mental Status: He is alert and oriented to person, place, and time.   Psychiatric:         Mood and Affect: Mood normal.         Body mass index is 21.35  kg/m.     Will complete labs today at [facility]      Plan:    1. Environmental and seasonal allergies  Assessment & Plan:  Recommend flonase or nasoquart spray  Avoid allergens when possible  Wear a mask at work when working in dust.     2. Marijuana smoker  3. Iron deficiency anemia, unspecified iron deficiency anemia type  Assessment & Plan:  Pt to complete labs today  4. Hypokalemia  Assessment & Plan:  Complete CMP today      Reviewed medication and completed medication reconciliation with the patient. Reviewed side effects of medications with the patient. Questions were answered and patient verb understanding.    Past Medical History:   Diagnosis Date    Elevated blood pressure reading     Environmental and seasonal allergies 03/19/2022    Hypertension  Hypokalemia 03/19/2022    Iron deficiency anemia 03/19/2022    Left ureteral stone 09/14/2020    Marijuana smoker 02/24/2020    Multiple lipomas 02/24/2020    Nephrolithiasis     7/12, 4/17, 4/22, 6/22 bilat    Pain in joint of right shoulder     Rotator cuff syndrome of left shoulder 2020    Dr Guilford Shi    TB (tuberculosis), treated     Weight loss 02/24/2020     No current outpatient medications on file.     No current facility-administered medications for this visit.       Past Surgical History:   Procedure Laterality Date    ORTHOPEDIC SURGERY      left leg pins     Allergies and Intolerances:   No Known Allergies  Family History:   Family History   Problem Relation Age of Onset    Hypertension Mother      Social History:   He  reports that he has never smoked. He has never been exposed to tobacco smoke. He has never used smokeless tobacco.   Social History     Substance and Sexual Activity   Alcohol Use Yes     Immunization History:  Immunization History   Administered Date(s) Administered    COVID-19, MODERNA BLUE border, Primary or Immunocompromised, (age 12y+), IM, 100 mcg/0.37mL 10/21/2019, 11/18/2019         Return in about 1 year (around 03/20/2023) for  Physical, Lab fasting (CMP, lipid, CBC).        Dr. Gaspar Bidding, AGNP-C, DNP  [facility]

## 2022-03-19 NOTE — Assessment & Plan Note (Signed)
Complete CMP today

## 2022-03-19 NOTE — Assessment & Plan Note (Signed)
Recommend flonase or nasoquart spray  Avoid allergens when possible  Wear a mask at work when working in dust.

## 2022-03-19 NOTE — Assessment & Plan Note (Signed)
Pt to complete labs today

## 2023-01-07 ENCOUNTER — Ambulatory Visit
Admit: 2023-01-07 | Discharge: 2023-01-07 | Payer: MEDICARE | Attending: Nurse Practitioner | Primary: Nurse Practitioner

## 2023-01-07 DIAGNOSIS — T148XXA Other injury of unspecified body region, initial encounter: Secondary | ICD-10-CM

## 2023-01-07 MED ORDER — PREDNISONE 10 MG PO TABS
10 MG | ORAL_TABLET | ORAL | 0 refills | Status: AC
Start: 2023-01-07 — End: 2023-01-15

## 2023-01-07 MED ORDER — METHOCARBAMOL 750 MG PO TABS
750 | ORAL_TABLET | Freq: Four times a day (QID) | ORAL | 0 refills | Status: AC
Start: 2023-01-07 — End: 2023-01-17

## 2023-01-07 NOTE — Progress Notes (Signed)
[facility]  [name]  [address]  [place], [place] [address]  [phone] fax    01/07/2023    Tim Peterson 1970-05-21 is a pleasant Black / African American male.       History of Present Illness  The patient presents for evaluation of lower back pain.    He reports experiencing sharp, stabbing pain in his lower back, which intensifies with certain movements and hinders his ability to walk. The pain, described as a burning sensation, extends down to his legs.  He denies incontinence of bowel or bladder.  He believes he has pulled a muscle in his back.  He has done this in the past.    The onset of the pain was five days ago while he was playing with his grandchildren. He recalls the pain worsening yesterday after bending down to lift a piece of fence at home. He does not recall any specific incident that could have caused the pain but remembers feeling slight discomfort while cleaning his car over the weekend.    He has been using Biofreeze roll-on for relief with minimal relief.  His occupation involves Buyer, retail.      Physical Exam      Physical Exam  Constitutional:       Appearance: Normal appearance.      Comments: Tall and slender   Eyes:      Extraocular Movements: Extraocular movements intact.      Conjunctiva/sclera: Conjunctivae normal.   Cardiovascular:      Rate and Rhythm: Normal rate and regular rhythm.      Heart sounds: Normal heart sounds.   Pulmonary:      Effort: Pulmonary effort is normal.      Breath sounds: Normal breath sounds.   Musculoskeletal:      Lumbar back: Tenderness present. No swelling or deformity. Decreased range of motion.        Back:       Comments: Patient is walking slightly slumped over due to pain in his lower back   Skin:     General: Skin is dry.   Neurological:      Mental Status: He is alert and oriented to person, place, and time.         Vitals:    01/07/23 1448   BP: 120/88   Site: Left Upper Arm    Position: Sitting   Cuff Size: Medium Adult   Pulse: 70   Resp: 18   Temp: 98.4 F (36.9 C)   TempSrc: Temporal   SpO2: 100%   Weight: 69.9 kg (154 lb)   Height: 1.854 m (6\' 1" )       Body mass index is 20.32 kg/m.     Results      Assessment & Plan    1. Muscle strain  Assessment & Plan:  The symptoms suggest a possible strain in the lower back region, likely exacerbated by lifting a piece of fence and physical activities such as wiping the car. He reports sharp, stabbing pain that sometimes radiates to his legs.     Instructed pt to try medication at bedtime before taking during waking hours to experiment and see if med will make them drowsy. If med makes them drowsy then pt is to avoid driving, avoid operating heavy machinery, or any other activity that requires alertness.    Instructed pt to avoid any strenuous exercise for a few days to allow the muscle to recover/heal. Instructed  to dampen a wash cloth, place into a ziplock bag, remove air and seal the bag. Place in microwave for 15-20 seconds. Careful it will be hot. Place a pillow case over the affected area, apply bag. This will administer moist heat to help relax muscle. Instructed pt to take extreme caution as this technique could cause a burn. Instructed pt not to use [facility] or comparable type creams and moist heat together as this may intensify the therapy and cause burns. Pt verb understanding.    A prescription for prednisone and Robaxin was provided. He was advised to continue using Biofreeze roll-on and to apply moist heat to the affected area. Over-the-counter pain relief such as Tylenol or acetaminophen was recommended, while NSAIDs like Motrin, ibuprofen, or Aleve were discouraged. He was instructed to avoid heavy lifting and strenuous activities for a few days. A work note was provided for his employer, excusing him from work until 01/09/2023.  Orders:  -     predniSONE (DELTASONE) 10 MG tablet; Take 3 tablets by mouth daily for 3 days,  THEN 2 tablets daily for 3 days, THEN 1 tablet daily for 3 days., Disp-18 tablet, R-0Normal  -     methocarbamol (ROBAXIN-750) 750 MG tablet; Take 1 tablet by mouth 4 times daily for 10 days, Disp-40 tablet, R-0Normal      Reviewed medication and completed medication reconciliation with the patient. Reviewed side effects of medications with the patient. Questions were answered and patient verb understanding.    Past Medical History:   Diagnosis Date    Elevated blood pressure reading     Environmental and seasonal allergies 03/19/2022    Hypertension     Hypokalemia 03/19/2022    Iron deficiency anemia 03/19/2022    Left ureteral stone 09/14/2020    Marijuana smoker 02/24/2020    Multiple lipomas 02/24/2020    Nephrolithiasis     7/12, 4/17, 4/22, 6/22 bilat    Pain in joint of right shoulder     Rotator cuff syndrome of left shoulder 2020    Dr Guido Sander    TB (tuberculosis), treated     Weight loss 02/24/2020     Current Outpatient Medications   Medication Sig    predniSONE (DELTASONE) 10 MG tablet Take 3 tablets by mouth daily for 3 days, THEN 2 tablets daily for 3 days, THEN 1 tablet daily for 3 days.    methocarbamol (ROBAXIN-750) 750 MG tablet Take 1 tablet by mouth 4 times daily for 10 days     No current facility-administered medications for this visit.       Past Surgical History:   Procedure Laterality Date    ORTHOPEDIC SURGERY      left leg pins     Allergies and Intolerances:   No Known Allergies  Family History:   Family History   Problem Relation Age of Onset    Hypertension Mother      Social History:   He  reports that he has never smoked. He has never been exposed to tobacco smoke. He has never used smokeless tobacco.   Social History     Substance and Sexual Activity   Alcohol Use Yes     Immunization History:  Immunization History   Administered Date(s) Administered    COVID-19, MODERNA BLUE border, Primary or Immunocompromised, (age 12y+), IM, 100 mcg/0.32mL 10/21/2019, 11/18/2019         Return if  symptoms worsen or fail to improve.      Dr.  Essie Christine, AGNP-C, DNP  [facility]     The patient (or guardian, if applicable) and other individuals in attendance with the patient were advised that Artificial Intelligence will be utilized during this visit to record and process the conversation to generate a clinical note. The patient (or guardian, if applicable) and other individuals in attendance at the appointment consented to the use of AI, including the recording.

## 2023-01-07 NOTE — Progress Notes (Signed)
Tim Peterson is a 52 y.o. male (DOB: Mar 04, 1971) presenting to address:    Chief Complaint   Patient presents with    Back Pain     Pt is unable to stand up straight ongoing x 5 days       Vitals:    01/07/23 1448   BP: 120/88   Pulse: 70   Resp: 18   Temp: 98.4 F (36.9 C)   SpO2: 100%       "Have you been to the ER, urgent care clinic since your last visit?  Hospitalized since your last visit?"    NO    "Have you seen or consulted any other health care providers outside of [facility] since your last visit?"    NO

## 2023-01-07 NOTE — Assessment & Plan Note (Addendum)
The symptoms suggest a possible strain in the lower back region, likely exacerbated by lifting a piece of fence and physical activities such as wiping the car. He reports sharp, stabbing pain that sometimes radiates to his legs.     Instructed pt to try medication at bedtime before taking during waking hours to experiment and see if med will make them drowsy. If med makes them drowsy then pt is to avoid driving, avoid operating heavy machinery, or any other activity that requires alertness.    Instructed pt to avoid any strenuous exercise for a few days to allow the muscle to recover/heal. Instructed to dampen a wash cloth, place into a ziplock bag, remove air and seal the bag. Place in microwave for 15-20 seconds. Careful it will be hot. Place a pillow case over the affected area, apply bag. This will administer moist heat to help relax muscle. Instructed pt to take extreme caution as this technique could cause a burn. Instructed pt not to use [facility] or comparable type creams and moist heat together as this may intensify the therapy and cause burns. Pt verb understanding.    A prescription for prednisone and Robaxin was provided. He was advised to continue using Biofreeze roll-on and to apply moist heat to the affected area. Over-the-counter pain relief such as Tylenol or acetaminophen was recommended, while NSAIDs like Motrin, ibuprofen, or Aleve were discouraged. He was instructed to avoid heavy lifting and strenuous activities for a few days. A work note was provided for his employer, excusing him from work until 01/09/2023.

## 2023-02-17 ENCOUNTER — Emergency Department: Admit: 2023-02-17 | Payer: BLUE CROSS/BLUE SHIELD | Primary: Nurse Practitioner

## 2023-02-17 ENCOUNTER — Inpatient Hospital Stay
Admit: 2023-02-17 | Discharge: 2023-02-17 | Disposition: A | Discharge status: Home / Self Care | Payer: BLUE CROSS/BLUE SHIELD | Admitting: Emergency Medicine

## 2023-02-17 DIAGNOSIS — S42142A Displaced fracture of glenoid cavity of scapula, left shoulder, initial encounter for closed fracture: Secondary | ICD-10-CM

## 2023-02-17 MED ORDER — ACETAMINOPHEN 500 MG PO TABS
500 | ORAL | Status: AC
Start: 2023-02-17 — End: 2023-02-17
  Administered 2023-02-17: 14:00:00 1000 mg via ORAL

## 2023-02-17 MED ORDER — IBUPROFEN 600 MG PO TABS
600 | ORAL | Status: AC
Start: 2023-02-17 — End: 2023-02-17
  Administered 2023-02-17: 14:00:00 600 mg via ORAL

## 2023-02-17 MED ORDER — LIDOCAINE 4 % EX PTCH
4 | CUTANEOUS | Status: DC
Start: 2023-02-17 — End: 2023-02-17
  Administered 2023-02-17: 14:00:00 1 via TRANSDERMAL

## 2023-02-17 MED ORDER — IBUPROFEN 600 MG PO TABS
600 MG | ORAL_TABLET | Freq: Four times a day (QID) | ORAL | 0 refills | Status: DC | PRN
Start: 2023-02-17 — End: 2023-03-27

## 2023-02-17 MED ORDER — METHOCARBAMOL 500 MG PO TABS
500 | Freq: Once | ORAL | Status: AC
Start: 2023-02-17 — End: 2023-02-17
  Administered 2023-02-17: 14:00:00 1000 mg via ORAL

## 2023-02-17 MED ORDER — METHOCARBAMOL 750 MG PO TABS
750 | ORAL_TABLET | Freq: Four times a day (QID) | ORAL | 0 refills | Status: AC | PRN
Start: 2023-02-17 — End: 2023-02-27

## 2023-02-17 MED FILL — METHOCARBAMOL 500 MG PO TABS: 500 MG | ORAL | Qty: 2

## 2023-02-17 MED FILL — ACETAMINOPHEN EXTRA STRENGTH 500 MG PO TABS: 500 MG | ORAL | Qty: 2

## 2023-02-17 MED FILL — LIDOCAINE PAIN RELIEF 4 % EX PTCH: 4 % | CUTANEOUS | Qty: 1

## 2023-02-17 MED FILL — IBUPROFEN 600 MG PO TABS: 600 MG | ORAL | Qty: 1

## 2023-02-17 NOTE — Discharge Instructions (Signed)
 Recommend taking extra strength Tylenol every 4-6 hours and ibuprofen 600 mg every 6 hours as needed for pain and discomfort.  Use muscle relaxers in addition to help.  Consider also over-the-counter magnesium supplements and increasing fluid intake.  Use
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
## 2023-02-17 NOTE — ED Notes (Signed)
 Patient is off the floor for CT.
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
## 2023-02-17 NOTE — ED Notes (Signed)
Patient returned from XRAY 

## 2023-02-25 ENCOUNTER — Ambulatory Visit: Admit: 2023-02-25 | Payer: BLUE CROSS/BLUE SHIELD | Admitting: Orthopaedic Surgery | Primary: Nurse Practitioner

## 2023-02-25 VITALS — Ht 73.0 in | Wt 160.0 lb

## 2023-02-25 DIAGNOSIS — S46912A Strain of unspecified muscle, fascia and tendon at shoulder and upper arm level, left arm, initial encounter: Principal | ICD-10-CM

## 2023-02-25 MED ORDER — MELOXICAM 15 MG PO TABS
15 MG | ORAL_TABLET | Freq: Every day | ORAL | 3 refills | Status: DC
Start: 2023-02-25 — End: 2023-06-23

## 2023-02-25 MED ORDER — BACLOFEN 10 MG PO TABS
10 MG | ORAL_TABLET | Freq: Three times a day (TID) | ORAL | 0 refills | Status: DC
Start: 2023-02-25 — End: 2023-06-23
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
## 2023-03-10 ENCOUNTER — Ambulatory Visit
Admit: 2023-03-10 | Discharge: 2023-03-10 | Payer: BLUE CROSS/BLUE SHIELD | Attending: Orthopaedic Surgery | Admitting: Orthopaedic Surgery | Primary: Nurse Practitioner

## 2023-03-10 VITALS — Temp 97.70000°F | Ht 73.0 in | Wt 160.0 lb

## 2023-03-10 DIAGNOSIS — S46912A Strain of unspecified muscle, fascia and tendon at shoulder and upper arm level, left arm, initial encounter: Principal | ICD-10-CM

## 2023-03-10 NOTE — Progress Notes (Signed)
 [facility]  1970/07/20   Chief Complaint   Patient presents with    Follow-up    Shoulder Pain     Left shoulder        HISTORY OF PRESENT ILLNESS  Tim Peterson is a 52 y.o. male who presents today for reevaluation of left shoulder pain. Danielle Dess

## 2023-03-11 ENCOUNTER — Telehealth: Admit: 2023-03-11 | Admitting: Nurse Practitioner

## 2023-03-11 DIAGNOSIS — D509 Iron deficiency anemia, unspecified: Secondary | ICD-10-CM

## 2023-03-11 NOTE — Telephone Encounter (Signed)
 Lab order for upcoming appt;    Future Appointments   Date Time Provider Department Center   03/14/2023 11:10 AM Cleophas Dadds, PT [facility] [place]   03/20/2023  7:45 AM [facility] LAB VISIT [facility] DEP   03/27/2023  8:00 AM Adrienne Horning [facility] DEP   04/07/2023  2:20 PM Samson Croak, MD [facility] AMB

## 2023-03-14 ENCOUNTER — Inpatient Hospital Stay
Admit: 2023-03-14 | Disposition: A | Discharge status: Home / Self Care | Payer: BLUE CROSS/BLUE SHIELD | Source: Ambulatory Visit | Admitting: Orthopaedic Surgery | Primary: Nurse Practitioner

## 2023-03-14 DIAGNOSIS — M25512 Pain in left shoulder: Principal | ICD-10-CM

## 2023-03-14 NOTE — Progress Notes (Signed)
 PT DAILY TREATMENT NOTE/SHOULDER EVAL 10-18      Patient Name: Tim Peterson    Date: 03/14/2023    DOB: [birth year]  [place] / [place] / Product Type: *No Product type* /      Patient DOB verified yes     Visit #   Current

## 2023-03-14 NOTE — Progress Notes (Signed)
 [facility]YSICAL THERAPY  [address] [place] [address] [place], [place] [address] Ph:[phone] Fx: [phone]    PLAN OF CARE/ Statement of Necessity for Physical Therapy Services           [facility] name: [facility]

## 2023-03-20 ENCOUNTER — Inpatient Hospital Stay
Admit: 2023-03-20 | Disposition: A | Discharge status: Home / Self Care | Payer: BLUE CROSS/BLUE SHIELD | Source: Ambulatory Visit | Admitting: Nurse Practitioner | Primary: Nurse Practitioner

## 2023-03-20 DIAGNOSIS — Z0000 Encounter for general adult medical examination without abnormal findings: Principal | ICD-10-CM

## 2023-03-20 LAB — COMPREHENSIVE METABOLIC PANEL
ALT: 51 U/L (ref 16–61)
AST: 35 U/L (ref 10–38)
Albumin/Globulin Ratio: 1.3 (ref 0.8–1.7)
Albumin: 4 g/dL (ref 3.4–5.0)
Alk Phosphatase: 61 U/L (ref 45–117)
Anion Gap: 1 mmol/L — ABNORMAL LOW (ref 3.0–18)
BUN/Creatinine Ratio: 14 (ref 12–20)
BUN: 15 mg/dL (ref 7.0–18)
CO2: 31 mmol/L (ref 21–32)
Calcium: 8.3 mg/dL — ABNORMAL LOW (ref 8.5–10.1)
Chloride: 107 mmol/L (ref 100–111)
Creatinine: 1.08 mg/dL (ref 0.6–1.3)
Est, Glom Filt Rate: 84 mL/min/{1.73_m2} (ref >60)
Globulin: 3 g/dL (ref 2.0–4.0)
Glucose: 106 mg/dL — ABNORMAL HIGH (ref 74–99)
Potassium: 4.7 mmol/L (ref 3.5–5.5)
Sodium: 139 mmol/L (ref 136–145)
Total Bilirubin: 0.5 mg/dL (ref 0.2–1.0)
Total Protein: 7 g/dL (ref 6.4–8.2)

## 2023-03-20 LAB — CBC WITH AUTO DIFFERENTIAL
Basophils %: 1 % (ref 0–2)
Basophils Absolute: 0.1 10*3/uL (ref 0.0–0.1)
Eosinophils %: 1 % (ref 0–5)
Eosinophils Absolute: 0.1 10*3/uL (ref 0.0–0.4)
Hematocrit: 42.9 % (ref 36.0–48.0)
Hemoglobin: 14.2 g/dL (ref 13.0–16.0)
Immature Granulocytes %: 0 % (ref 0.0–0.5)
Immature Granulocytes Absolute: 0 10*3/uL (ref 0.00–0.04)
Lymphocytes %: 29 % (ref 21–52)
Lymphocytes Absolute: 1.7 10*3/uL (ref 0.9–3.6)
MCH: 32.5 pg (ref 24.0–34.0)
MCHC: 33.1 g/dL (ref 31.0–37.0)
MCV: 98.2 FL (ref 78.0–100.0)
MPV: 9 FL — ABNORMAL LOW (ref 9.2–11.8)
Monocytes %: 8 % (ref 3–10)
Monocytes Absolute: 0.5 10*3/uL (ref 0.05–1.2)
Neutrophils %: 60 % (ref 40–73)
Neutrophils Absolute: 3.5 10*3/uL (ref 1.8–8.0)
Nucleated RBCs: 0 /100{WBCs}
Platelets: 317 10*3/uL (ref 135–420)
RBC: 4.37 M/uL (ref 4.35–5.65)
RDW: 13.3 % (ref 11.6–14.5)
WBC: 5.9 10*3/uL (ref 4.6–13.2)
nRBC: 0 10*3/uL (ref 0.00–0.01)

## 2023-03-20 LAB — LIPID PANEL
Chol/HDL Ratio: 2.8 (ref 0–5.0)
Cholesterol, Total: 178 mg/dL (ref <200)
HDL: 63 mg/dL — ABNORMAL HIGH (ref 40–60)
LDL Cholesterol: 104.4 mg/dL — ABNORMAL HIGH (ref 0–100)
Triglycerides: 53 mg/dL (ref <150)
VLDL Cholesterol Calculated: 10.6 mg/dL

## 2023-03-27 ENCOUNTER — Ambulatory Visit
Admit: 2023-03-27 | Discharge: 2023-03-27 | Payer: BLUE CROSS/BLUE SHIELD | Attending: Nurse Practitioner | Primary: Nurse Practitioner
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
## 2023-03-27 NOTE — Assessment & Plan Note (Signed)
The patient's elevated blood pressure is likely due to pain from the shoulder injury. He does not have a history of chronic hypertension.     Patient was advised to purchase an Omron blood pressure cuff for home monitoring. He should check his blood pressure at different times of the day and record the readings.     A virtual follow-up appointment will be scheduled in 2 weeks to assess his blood pressure levels.

## 2023-03-27 NOTE — Progress Notes (Signed)
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  Hospitalized since your last visit?"    NO    "Have you seen or consulted any other health care providers outside of [facility] since your last visit?"    NO

## 2023-03-27 NOTE — Assessment & Plan Note (Signed)
TG 53; HDL 63; LDL 104  He is advised to continue eating a healthy diet, avoiding fried and fatty foods, and to monitor his cholesterol levels regularly.  Recheck level 12 months

## 2023-03-27 NOTE — Assessment & Plan Note (Signed)
The patient's glucose levels are on the higher end of the normal range.     Ordered A1c to be completed by Saturday     He is advised to maintain a healthy diet and monitor his sugar intake.

## 2023-03-27 NOTE — Assessment & Plan Note (Signed)
Calcium level 8.3    Additional labs needed vitamin D, PTH, Vit D, Mag, Phosphorus to rule out parathy dz.    He is advised to get these labs done at [facility] on Saturday morning.

## 2023-03-31 ENCOUNTER — Inpatient Hospital Stay: Payer: BLUE CROSS/BLUE SHIELD | Primary: Nurse Practitioner

## 2023-04-07 ENCOUNTER — Ambulatory Visit: Payer: BLUE CROSS/BLUE SHIELD | Attending: Orthopaedic Surgery | Primary: Nurse Practitioner

## 2023-04-07 NOTE — Progress Notes (Deleted)
Tim Peterson  30-Sep-1970   No chief complaint on file.       HISTORY OF PRESENT ILLNESS  Tim Peterson is a 53 y.o. male who presents today for reevaluation of left shoulder pain. Pain is a ***/10.  Patient denies any fever, chills, chest pain, shortness of breath or calf pain. The remainder of the review of systems is negative. There are no new illness or injuries other than that mentioned above to report since last seen in the office. No changes in medications, allergies, social or family history.      PHYSICAL EXAM:   There were no vitals taken for this visit.  The patient is a well-developed, well-nourished male   in no acute distress.  The patient is alert and oriented times three.  The patient is alert and oriented times three. Mood and affect are normal.  LYMPHATIC: lymph nodes are not enlarged and are within normal limits  SKIN: normal in color and non tender to palpation. There are no bruises or abrasions noted.   NEUROLOGICAL: Motor sensory exam is within normal limits. Reflexes are equal bilaterally. There is normal sensation to pinprick and light touch  MUSCULOSKELETAL:  Examination Left shoulder Right shoulder   Skin Intact Intact   AC joint tenderness - -   Biceps tenderness - -   Forward flexion/Elevation ROM 180 180   Active abduction ROM 160 160   Glenohumeral abduction 90 90   External rotation ROM 90 90   Internal rotation ROM 70 70   Apprehension - -   Jobe's Relocation - -   Jerk - -   Load and Shift - -   O'briens - -   Speeds - -   Impingement sign - -   Supraspinatus/Empty Can -, 5/5 -, 5/5   External Rotation Strength -, 5/5 -, 5/5   Lift Off/Belly Press -, 5/5 -, 5/5   Neurovascular Intact Intact          PROCEDURE: ***    IMAGING: XR of the left shoulder with 2 views obtained by [facility] ED on 02/17/2023 reviewed and read by Dr.Luciano: There is a small bone density in the inferior aspect of the glenoid appears chronic in character     IMPRESSION:  No diagnosis found.     PLAN:   1.  Pt presents today with left shoulder pain secondary to ***  Risk factors include: ***  2. {YES/NO:19726} cortisone injection indicated today   3. {YES/NO:19726} Physical/Occupational Therapy indicated today  4. {YES/NO:19726} diagnostic test indicated today:   5. {YES/NO:19726} durable medical equipment indicated today  6. {YES/NO:19726} referral indicated today   7. {YES/NO:19726} medications indicated today:   8. {YES/NO:19726} Narcotic indicated today for short term acute pain secondary to ***. Patient given pain medication for short term acute pain relief. Goal is to treat patient according to above plan and to ultimately have patient off all pain medications once appropriate. If chronic pain management is required beyond what is expected for current orthopedic problem, will refer patient to pain management. PMP was reviewed and will be reviewed with every medication refill request.       RTC ***       By signing my name below, I Bethena Midget, SCRIBE, attest that this documentation has been prepared under the direction and in the presence of Bettina Gavia, MD  Electronically signed: Bethena Midget, SCRIBE. 04/07/2023 12:19 PM EST    I, Bettina Gavia, MD, personally performed the services described in this  documentation. I have authorized the scribe to complete the medical record entries input within this chart. I have reviewed the chart and agree that the record reflects my personal performance and is accurate and complete. [Electronically Signed: Bettina Gavia, MD. 04/07/2023 12:19 PM EST]

## 2023-04-10 ENCOUNTER — Encounter: Payer: BLUE CROSS/BLUE SHIELD | Attending: Nurse Practitioner | Primary: Nurse Practitioner

## 2023-04-10 NOTE — Telephone Encounter (Signed)
Patient was scheduled today for a 2 week follow up for bp check VV. Pt stated that he is unable to step away from work and canceled his appt for today. Attempted to reschedule, however per pt he do know when he will be able to reschedule for. Pt stated that his bp has been averaging 130/90.

## 2023-04-11 ENCOUNTER — Inpatient Hospital Stay: Payer: BLUE CROSS/BLUE SHIELD | Primary: Nurse Practitioner

## 2023-04-11 NOTE — Progress Notes (Signed)
[facility]  [address]  [place], [place] [address]  [phone]  [phone] fax    Physical Therapy Discharge Summary  Patient name: Tim Peterson Start of Care: 03/14/23   Referral source: Bettina Gavia,* DOB: 1970-07-25   Medical/Treatment Diagnosis: Left shoulder pain [M25.512]  [place] [employer] / [place] [employer] [place] / Product Type: *No Product type* /  Onset Date:02/17/23     Prior Hospitalization: see medical history Provider#: [id number]   Medications: Verified on Patient Summary List    Comorbidities: HTN, Marijuana smoker, hypokalemia, TB, Multiple lipomas, Rotator cuff syndrome left.      Prior Level of Function: functionally independent, no AD, active lifestyle   Visits from Start of Care: 1    Missed Visits: 2    Reporting Period : 03/14/23 to 03/14/23    Goals/Measure of Progress:  Short Term Goals: To be accomplished in 2 weeks:  Patient will be independent and compliant with HEP to progress toward goals and restore functional mobility.   Eval Status: issued at eval     Patient will improve pain in left shoulder to 3/10 at worst to improve OH reach tolerance and restore prior level of function.  Eval Status: 4/10 at worst     Long Term Goals: To be accomplished in 4 weeks:  Patient will improve Quick DASH score to 20% disability to improve functional tolerance for ADLs.  Eval Status: 45.45% disability     Pt will have painfree left shoulder AROM at least 160 deg flex/abd to aid in functional mechanics for ADLs.  Eval Status: flex and abd 150 deg.      Pt will have at least 5-/5 L shoulder strength to improve joint stability and mechanics while lifting and reaching activities.  Eval Status:   Shoulder Left (1-5)   Shoulder Flexion 4   Shoulder Ext 4   Shoulder ABD 4-   Shoulder ADD 4   Shoulder IR 4-   Shoulder ER 4-         Patient will improve pain in left shoulder to 2/10 at worst to improve job duty tolerance and restore prior level of  function.  Eval Status: 4/10 at worst       Assessment/ Summary of Care: Patient has not returned to PT since 03/14/23; unable to reassess goals; unplanned D/C.      RECOMMENDATIONS:  [x] Discontinue therapy: [] Patient has reached or is progressing toward set goals      [x] Patient is non-compliant or has abdicated      [] Due to lack of appreciable progress towards set goals    Dorene Ar, PT 04/11/2023 2:47 PM

## 2023-04-16 NOTE — Progress Notes (Signed)
A user error has taken place: encounter opened in error, closed for administrative reasons.

## 2023-05-02 ENCOUNTER — Inpatient Hospital Stay
Admit: 2023-05-02 | Discharge: 2023-05-02 | Disposition: A | Discharge status: Home / Self Care | Payer: BLUE CROSS/BLUE SHIELD | Attending: Emergency Medicine

## 2023-05-02 DIAGNOSIS — N200 Calculus of kidney: Secondary | ICD-10-CM

## 2023-05-02 DIAGNOSIS — E876 Hypokalemia: Secondary | ICD-10-CM

## 2023-05-02 LAB — COMPREHENSIVE METABOLIC PANEL
ALT: 21 U/L (ref 16–61)
AST: 16 U/L (ref 10–38)
Albumin/Globulin Ratio: 1.3 (ref 0.8–1.7)
Albumin: 4.2 g/dL (ref 3.4–5.0)
Alk Phosphatase: 64 U/L (ref 45–117)
Anion Gap: 7 mmol/L (ref 3.0–18)
BUN/Creatinine Ratio: 11 — ABNORMAL LOW (ref 12–20)
BUN: 13 mg/dL (ref 7.0–18)
CO2: 24 mmol/L (ref 21–32)
Calcium: 9.3 mg/dL (ref 8.5–10.1)
Chloride: 109 mmol/L (ref 100–111)
Creatinine: 1.14 mg/dL (ref 0.6–1.3)
Est, Glom Filt Rate: 78 mL/min/{1.73_m2} (ref >60)
Globulin: 3.3 g/dL (ref 2.0–4.0)
Glucose: 118 mg/dL — ABNORMAL HIGH (ref 74–99)
Potassium: 3.2 mmol/L — ABNORMAL LOW (ref 3.5–5.5)
Sodium: 140 mmol/L (ref 136–145)
Total Bilirubin: 0.4 mg/dL (ref 0.2–1.0)
Total Protein: 7.5 g/dL (ref 6.4–8.2)

## 2023-05-02 LAB — CBC WITH AUTO DIFFERENTIAL
Basophils %: 0.5 % (ref 0.0–2.0)
Basophils Absolute: 0.06 10*3/uL (ref 0.00–0.10)
Eosinophils %: 0.5 % (ref 0.0–5.0)
Eosinophils Absolute: 0.05 10*3/uL (ref 0.00–0.40)
Hematocrit: 40.5 % (ref 36.0–48.0)
Hemoglobin: 14.1 g/dL (ref 13.0–16.0)
Immature Granulocytes %: 0.4 % (ref 0.0–0.5)
Immature Granulocytes Absolute: 0.04 10*3/uL (ref 0.00–0.04)
Lymphocytes %: 16.8 % — ABNORMAL LOW (ref 21.0–52.0)
Lymphocytes Absolute: 1.83 10*3/uL (ref 0.90–3.60)
MCH: 31.9 pg (ref 24.0–34.0)
MCHC: 34.8 g/dL (ref 31.0–37.0)
MCV: 91.6 fL (ref 78.0–100.0)
MPV: 9.3 fL (ref 9.2–11.8)
Monocytes %: 5.4 % (ref 3.0–10.0)
Monocytes Absolute: 0.59 10*3/uL (ref 0.05–1.20)
Neutrophils %: 76.4 % — ABNORMAL HIGH (ref 40.0–73.0)
Neutrophils Absolute: 8.34 10*3/uL — ABNORMAL HIGH (ref 1.80–8.00)
Nucleated RBCs: 0 /100{WBCs}
Platelets: 287 10*3/uL (ref 135–420)
RBC: 4.42 M/uL (ref 4.35–5.65)
RDW: 13 % (ref 11.6–14.5)
WBC: 10.9 10*3/uL (ref 4.6–13.2)
nRBC: 0 10*3/uL (ref 0.00–0.01)

## 2023-05-02 LAB — URINALYSIS
Bilirubin, Urine: NEGATIVE
Glucose, Ur: NEGATIVE mg/dL
Ketones, Urine: 15 mg/dL — AB
Leukocyte Esterase, Urine: NEGATIVE
Nitrite, Urine: NEGATIVE
Protein, UA: NEGATIVE mg/dL
Specific Gravity, UA: 1.01 (ref 1.005–1.030)
Urobilinogen, Urine: 0.2 U/dL (ref 0.2–1.0)
pH, Urine: 8 (ref 5.0–8.0)

## 2023-05-02 LAB — LIPASE: Lipase: 30 U/L (ref 13–75)

## 2023-05-02 LAB — URINALYSIS, MICRO: BACTERIA, URINE: NEGATIVE /[HPF]

## 2023-05-02 LAB — CHLAMYDIA, GONORRHEA, TRICHOMONIASIS
Chlamydia trachomatis, NAA: NEGATIVE
Neisseria Gonorrhoeae, NAA: NEGATIVE
Trichomonas Vaginalis by NAA: NEGATIVE

## 2023-05-02 MED ORDER — KETOROLAC TROMETHAMINE 15 MG/ML IJ SOLN
15 | Freq: Once | INTRAMUSCULAR | Status: AC
Start: 2023-05-02 — End: 2023-05-02
  Administered 2023-05-02: 12:00:00 15 mg via INTRAVENOUS

## 2023-05-02 MED ORDER — POTASSIUM BICARB-CITRIC ACID 20 MEQ PO TBEF
20 MEQ | ORAL | Status: AC
Start: 2023-05-02 — End: 2023-05-02
  Administered 2023-05-02: 14:00:00 40 meq via ORAL

## 2023-05-02 MED ORDER — TAMSULOSIN HCL 0.4 MG PO CAPS
0.4 | ORAL_CAPSULE | Freq: Every day | ORAL | 0 refills | Status: DC
Start: 2023-05-02 — End: 2023-05-02

## 2023-05-02 MED ORDER — SODIUM CHLORIDE 0.9 % IV BOLUS
0.9 | Freq: Once | INTRAVENOUS | Status: AC
Start: 2023-05-02 — End: 2023-05-02
  Administered 2023-05-02: 12:00:00 1000 mL via INTRAVENOUS

## 2023-05-02 MED ORDER — ONDANSETRON 4 MG PO TBDP
4 | ORAL_TABLET | ORAL | 0 refills | Status: AC | PRN
Start: 2023-05-02 — End: ?

## 2023-05-02 MED ORDER — ONDANSETRON HCL 4 MG/2ML IJ SOLN
42 | Freq: Once | INTRAMUSCULAR | Status: AC
Start: 2023-05-02 — End: 2023-05-02
  Administered 2023-05-02: 12:00:00 4 mg via INTRAVENOUS

## 2023-05-02 MED ORDER — KETOROLAC TROMETHAMINE 10 MG PO TABS
10 MG | ORAL_TABLET | Freq: Four times a day (QID) | ORAL | 0 refills | Status: DC | PRN
Start: 2023-05-02 — End: 2023-06-23

## 2023-05-02 MED ORDER — TAMSULOSIN HCL 0.4 MG PO CAPS
0.4 MG | ORAL_CAPSULE | Freq: Every day | ORAL | 0 refills | Status: DC
Start: 2023-05-02 — End: 2023-06-23

## 2023-05-02 MED FILL — ONDANSETRON HCL 4 MG/2ML IJ SOLN: 42 MG/2ML | INTRAMUSCULAR | Qty: 2

## 2023-05-02 MED FILL — SODIUM CHLORIDE 0.9 % IV SOLN: 0.9 % | INTRAVENOUS | Qty: 1000

## 2023-05-02 MED FILL — KETOROLAC TROMETHAMINE 15 MG/ML IJ SOLN: 15 MG/ML | INTRAMUSCULAR | Qty: 1

## 2023-05-02 MED FILL — EFFER-K 20 MEQ PO TBEF: 20 MEQ | ORAL | Qty: 2

## 2023-05-02 NOTE — ED Triage Notes (Signed)
Patient A/O x 4, presented to the ED with complaint of left sided flank pain, blood in urine x this morning. Patient has hx of kidney stones.

## 2023-05-02 NOTE — ED Provider Notes (Signed)
[facility] EMERGENCY DEPARTMENT  EMERGENCY DEPARTMENT ENCOUNTER      Pt Name: Tim Peterson  MRN: [id number]  Birthdate 1970/04/29  Date of evaluation: 05/02/2023  Provider: Kelle Ruppert Festus Barren, MD    CHIEF COMPLAINT       Chief Complaint   Patient presents with    Flank Pain    Hematuria         HISTORY OF PRESENT ILLNESS   (Location/Symptom, Timing/Onset, Context/Setting, Quality, Duration, Modifying Factors, Severity)  Note limiting factors.   Tim Peterson is a 53 y.o. male who presents to the emergency department for left-sided flank pain rating down to his testicles with some blood in his urine.  He states this started this morning when he woke up to go to work.  He has had multiple bouts of vomiting associated with this.  He states he has a history of kidney stones and feels similar to past kidney stones.  Denies any dysuria.  Denies any symptoms yesterday.  Denies any anterior abdominal pain.  Having regular bowel movements.     Nursing Notes were reviewed.    REVIEW OF SYSTEMS    (2-9 systems for level 4, 10 or more for level 5)     Constitutional: Positive for chills but no fever  HENT: [no ear pain]  Eyes: [no change in vision]  Respiratory: [no SOB]  Cardio: [no chest pain]  GI: [no blood in stool]  GU: Positive for hematuria  MSK: Positive for left-sided back pain  Skin: [no rashes]  Neuro: [no headache]    Except as noted above the remainder of the review of systems was reviewed and negative.       PAST MEDICAL HISTORY     Past Medical History:   Diagnosis Date    Closed fracture of glenoid cavity and neck of left scapula 03/27/2023    Dyslipidemia 03/27/2023    Elevated blood pressure reading     Environmental and seasonal allergies 03/19/2022    Hypertension     Hypokalemia 03/19/2022    Iron deficiency anemia 03/19/2022    Left ureteral stone 09/14/2020    Marijuana smoker 02/24/2020    Multiple lipomas 02/24/2020    Nephrolithiasis     7/12, 4/17, 4/22, 6/22 bilat    Pain in joint of  right shoulder     Rotator cuff syndrome of left shoulder 2020    Dr Guido Sander    TB (tuberculosis), treated     Weight loss 02/24/2020         SURGICAL HISTORY       Past Surgical History:   Procedure Laterality Date    ORTHOPEDIC SURGERY      left leg pins         CURRENT MEDICATIONS       Current Discharge Medication List        CONTINUE these medications which have NOT CHANGED    Details   meloxicam (MOBIC) 15 MG tablet Take 1 tablet by mouth daily  Qty: 30 tablet, Refills: 3    Associated Diagnoses: Strain of left shoulder, initial encounter      baclofen (LIORESAL) 10 MG tablet Take 1 tablet by mouth 3 times daily  Qty: 90 tablet, Refills: 0    Associated Diagnoses: Strain of left shoulder, initial encounter             ALLERGIES     Patient has no known allergies.    FAMILY HISTORY  Family History   Problem Relation Age of Onset    Hypertension Mother           SOCIAL HISTORY       Social History     Socioeconomic History    Marital status: Single   Tobacco Use    Smoking status: Never     Passive exposure: Never    Smokeless tobacco: Never   Substance and Sexual Activity    Alcohol use: Yes    Drug use: Yes     Types: Marijuana Sheran Fava)   Social History Narrative    ** Merged History Encounter **          Social Determinants of Health     Financial Resource Strain: Low Risk  (01/07/2023)    Overall Financial Resource Strain (CARDIA)     Difficulty of Paying Living Expenses: Not hard at all   Food Insecurity: No Food Insecurity (01/07/2023)    Hunger Vital Sign     Worried About Running Out of Food in the Last Year: Never true     Ran Out of Food in the Last Year: Never true   Transportation Needs: Unknown (01/07/2023)    PRAPARE - Transportation     Lack of Transportation (Non-Medical): No   Housing Stability: Unknown (01/07/2023)    Housing Stability Vital Sign     Homeless in the Last Year: No       SCREENINGS         Glasgow Coma Scale  Eye Opening: Spontaneous  Best Verbal Response: Oriented  Best Motor  Response: Obeys commands  Glasgow Coma Scale Score: 15                     CIWA Assessment  BP: 125/78  Pulse: 85                 PHYSICAL EXAM    (up to 7 for level 4, 8 or more for level 5)     ED Triage Vitals [05/02/23 0709]   BP Systolic BP Percentile Diastolic BP Percentile Temp Temp Source Pulse Respirations SpO2   (!) 187/97 -- -- 98 F (36.7 C) Oral 85 18 100 %      Height Weight - Scale         -- 72.6 kg (160 lb)             Physical Exam    General: Patient looks uncomfortable, curled up in a ball but in no acute distress  Head: [Normocephalic, atraumatic]  Psych: [cooperative and alert]  Eyes: [No scleral icterus, normal conjunctiva]  ENT: [moist oral mucosa]  Neck: [supple]  CV: Regular rate and rhythm pulm: [clear breath sounds bilaterally without any wheezing or rhonchi, normal respiratory rate]  GI: [normal bowel sounds, soft, non-tender], no CVA tenderness  MSK: [moves all four extremities]  Skin: [no rashes]  Neuro: [alert and conversive]       DIAGNOSTIC RESULTS     EKG: All EKG's are interpreted by the Emergency Department Physician who either signs or Co-signs this chart in the absence of a cardiologist.    [ ]     RADIOLOGY:   Non-plain film images such as CT, Ultrasound and MRI are read by the radiologist. Plain radiographic images are visualized and preliminarily interpreted by the emergency physician with the below findings:    [ ]     Interpretation per the Radiologist below, if available at the time of this note:  No orders to display         ED BEDSIDE ULTRASOUND:   Performed by ED Physician - none    LABS:  Labs Reviewed   CBC WITH AUTO DIFFERENTIAL - Abnormal; Notable for the following components:       Result Value    Neutrophils % 76.4 (*)     Lymphocytes % 16.8 (*)     Neutrophils Absolute 8.34 (*)     All other components within normal limits   COMPREHENSIVE METABOLIC PANEL - Abnormal; Notable for the following components:    Potassium 3.2 (*)     Glucose 118 (*)      BUN/Creatinine Ratio 11 (*)     All other components within normal limits   URINALYSIS - Abnormal; Notable for the following components:    Ketones, Urine 15 (*)     Blood, Urine MODERATE (*)     All other components within normal limits   URINALYSIS, MICRO - Abnormal; Notable for the following components:    Mucus, UA 1+ (*)     All other components within normal limits   CHLAMYDIA, GONORRHEA, TRICHOMONIASIS   LIPASE       All other labs were within normal range or not returned as of this dictation.    EMERGENCY DEPARTMENT COURSE and DIFFERENTIAL DIAGNOSIS/MDM:   Vitals:    Vitals:    05/02/23 0709 05/02/23 0721 05/02/23 0731 05/02/23 0801   BP: (!) 187/97 (!) 180/87 (!) 164/91 125/78   Pulse: 85      Resp: 18      Temp: 98 F (36.7 C)      TempSrc: Oral      SpO2: 100% 100% 100% 99%   Weight: 72.6 kg (160 lb)          Medical Decision Making  Amount and/or Complexity of Data Reviewed  Labs: ordered.    Risk  Prescription drug management.    Patient is a 53 year old male who presents with left-sided flank pain rating to his kidneys with hematuria.  His symptoms are consistent with kidney stones.  He has extensive history of this previously.  He is noted to be hypertensive but he is actively vomiting and states he is in a lot of pain.  Will see if this improves with symptom control.  He will be treated with Toradol, fluids and Zofran.  We will check his urine and blood work.  Since he does not have any CVA tenderness and is never required procedures for his kidney stones before we will hold off on imaging for now.    CMP, CBC, lipase are within normal limits with exception of a low potassium which will be replaced orally.  His kidney function appears to be at his baseline.  UA shows blood but no active signs of infection.    On reexamination patient feels much better after the Toradol and Zofran.  He is no longer vomiting.  Continues have a benign abdominal exam.  Overall I think that the patient's symptoms are  consistent with a kidney stone however I do not see any signs of complication at this time and therefore with discussion with the patient I do not feel that a CT scan is required.  He will be sent home with Toradol, Flomax and Zofran.  Discussed the importance of increasing hydration.      Patient stable for discharge at this time.  Patient is in agreement with the plan to be discharged at this time.  All the patient's questions were answered.  Patient was given written instructions on the diagnosis, and states understanding of the plan moving forward.  We did discuss important signs and symptoms that should prompt quick return to the emergency department.      CRITICAL CARE TIME   Total Critical Care time was 0 minutes, excluding separately reportable procedures.  There was a high probability of clinically significant/life threatening deterioration in the patient's condition which required my urgent intervention.     CONSULTS:  None    PROCEDURES:  Unless otherwise noted below, none     Procedures      FINAL IMPRESSION      1. Kidney stone on left side    2. Hypokalemia          DISPOSITION/PLAN   DISPOSITION Decision To Discharge 05/02/2023 09:01:32 AM      PATIENT REFERRED TO:  [facility] [place]  [address]  [place] [address]  [phone]  Schedule an appointment as soon as possible for a visit         DISCHARGE MEDICATIONS:  Current Discharge Medication List        START taking these medications    Details   ketorolac (TORADOL) 10 MG tablet Take 1 tablet by mouth every 6 hours as needed for Pain  Qty: 20 tablet, Refills: 0      ondansetron (ZOFRAN-ODT) 4 MG disintegrating tablet Take 1 tablet by mouth every 4 hours as needed for Nausea or Vomiting  Qty: 21 tablet, Refills: 0      tamsulosin (FLOMAX) 0.4 MG capsule Take 1 capsule by mouth daily  Qty: 90 capsule, Refills: 0           Controlled Substances Monitoring:          No data to display                (Please note that portions of  this note were completed with a voice recognition program.  Efforts were made to edit the dictations but occasionally words are mis-transcribed.)    Armine Rizzolo Festus Barren, MD (electronically signed)  Attending Emergency Physician            Gerrit Halls, MD  05/02/23 [phone]

## 2023-05-02 NOTE — Discharge Instructions (Addendum)
Drink lots of fluids to help flush out the stone.  If you are unable to urinate for more than 6 hours, develop a fever or severe worsening of your pain then please return to the emergency department.

## 2023-06-18 ENCOUNTER — Encounter: Payer: BLUE CROSS/BLUE SHIELD | Primary: Nurse Practitioner

## 2023-06-20 ENCOUNTER — Telehealth

## 2023-06-20 ENCOUNTER — Encounter: Payer: BLUE CROSS/BLUE SHIELD | Attending: Nurse Practitioner | Primary: Nurse Practitioner

## 2023-06-20 ENCOUNTER — Encounter: Payer: BLUE CROSS/BLUE SHIELD | Primary: Nurse Practitioner

## 2023-06-20 NOTE — Telephone Encounter (Signed)
 Lab orders for upcoming appt;    Future Appointments   Date Time Provider Department Center   06/23/2023 10:30 AM [facility], Maryanna Shape, DNP [facility] DEP

## 2023-06-23 ENCOUNTER — Ambulatory Visit
Admit: 2023-06-23 | Discharge: 2023-06-23 | Payer: BLUE CROSS/BLUE SHIELD | Attending: Nurse Practitioner | Primary: Nurse Practitioner

## 2023-06-23 VITALS — BP 138/92 | HR 79 | Temp 98.30000°F | Resp 18 | Ht 73.0 in | Wt 157.0 lb

## 2023-06-23 DIAGNOSIS — I10 Essential (primary) hypertension: Secondary | ICD-10-CM

## 2023-06-23 MED ORDER — AMLODIPINE BESYLATE 5 MG PO TABS
5 | ORAL_TABLET | Freq: Every day | ORAL | 0 refills | 30.00000 days | Status: DC
Start: 2023-06-23 — End: 2023-09-25

## 2023-06-23 NOTE — Assessment & Plan Note (Signed)
 Pt has not completed labs ordered in Jan 2025.   Strongly encouraged pt to complete.    03/27/2023:  Calcium level 8.3    Additional labs needed vitamin D, PTH, Vit D, Mag, Phosphorus to rule out parathy dz.    He is advised to get these labs done at [facility] on Saturday morning.

## 2023-06-23 NOTE — Assessment & Plan Note (Addendum)
-   The etiology of the headaches could be multifactorial, potentially stemming from uncontrolled hypertension, migraines, or a viral infection.   - A CT head scan will be ordered to rule out any intracranial abnormalities. If CT negative, may refer to neuro  - He is advised to schedule the head scan and echocardiogram concurrently.  - Follow-up in 3 months to monitor symptoms and treatment effectiveness.

## 2023-06-23 NOTE — Assessment & Plan Note (Signed)
-   Given his age and symptoms of fatigue, a stress test will be ordered to rule out any cardiac involvement.  - He is advised to schedule the stress test. Central scheduling number provided   - Follow-up in 3 months to monitor symptoms and treatment effectiveness.

## 2023-06-23 NOTE — Progress Notes (Signed)
 [facility]  [address] [name]  [address]  [place], [place] [address]  [phone] fax    06/23/2023    Tim Peterson 1971-01-08 is a pleasant Black / African American male.       History of Present Illness  The patient is a 53 year old male who presents for evaluation of headaches, elevated BP, dizziness, and fatigue.    He reports experiencing severe headaches once or twice a week, which he describes as unbearable. He does not have a history of migraines, but his mother does. He does not recall any visual disturbances such as white dots during these episodes. He recounts an incident from the previous Wednesday when he went to bed with a mild headache, which escalated to severe pain upon awakening. Despite the discomfort, he attempted to go to work but had to stop after driving one exit due to dizziness. He remained in his car until 9:30 AM before attempting to return home. During this journey, he experienced dizziness, confusion, profuse sweating, and numbness in both hands, prompting him to pull over. He also felt nauseous but was unable to vomit. A tow truck driver offered to call for assistance, but he declined, believing he could manage the situation. Upon reaching home, he vomited clear fluid. His mother assisted him into the house where he took Excedrin, ate a peanut butter sandwich, and drank a Pepsi before resting. By 5:00 PM, his symptoms had completely resolved. He did not consume cannabis that morning, which is part of his usual routine. He also did not have his usual morning coffee. He took Zofran for nausea - effective.    He has not recently monitored his blood pressure at home, which was previously recorded as 130/90. He maintains a healthy diet, typically eating twice a day and limiting his salt intake. He reports no significant shortness of breath but does experience fatigue. He was previously prescribed antihypertensive medication while incarcerated due  to extremely high blood pressure, but this was discontinued after his release in 2011 when his blood pressure normalized.    He is not currently taking meloxicam or baclofen, which were prescribed following an accident. He was prescribed Flomax in the ER in February for a kidney stone, which he has since passed and is no longer taking the medication. He used to see urology but not interested in seeing urology at this time as he "knows how to manage."    SOCIAL HISTORY  He admits to smoking weed.    FAMILY HISTORY  His mother has a history of migraines. Both his mother and father have a history of high blood pressure.      Physical Exam      Physical Exam  Constitutional:       Appearance: Normal appearance.   Eyes:      Extraocular Movements: Extraocular movements intact.      Conjunctiva/sclera: Conjunctivae normal.   Neck:      Vascular: No carotid bruit.   Cardiovascular:      Rate and Rhythm: Normal rate and regular rhythm.      Heart sounds: Normal heart sounds.   Pulmonary:      Effort: Pulmonary effort is normal.      Breath sounds: Normal breath sounds.   Musculoskeletal:         General: Normal range of motion.   Skin:     General: Skin is dry.   Neurological:      Mental Status: He is alert and  oriented to person, place, and time.   Psychiatric:         Mood and Affect: Mood normal.         Vitals:    06/23/23 1029   BP: (!) 138/92   BP Site: Left Upper Arm   Patient Position: Sitting   BP Cuff Size: Medium Adult   Pulse: 79   Resp: 18   Temp: 98.3 F (36.8 C)   TempSrc: Temporal   SpO2: 100%   Weight: 71.2 kg (157 lb)   Height: 1.854 m (6\' 1" )       Body mass index is 20.71 kg/m.     Results      Assessment & Plan    1. Uncontrolled hypertension  Assessment & Plan:  - Blood pressure readings have consistently been elevated, with diastolic values ranging from 90 to 92.  - Initiate amlodipine 5 mg qd, sent to [facility].  - He is instructed to take this medication in the morning before work  and monitor his blood pressure 2 hours post-administration, reporting any readings exceeding 140/90 to this office. If BP levels remain elevated, may increase amlodipine to 10mg  qd.   - An echocardiogram will be ordered to further evaluate cardiac function d/t sx of fatigue and presyncope.    04/10/2023:  Patient was scheduled today for a 2 week follow up for bp check VV. Pt stated that he is unable to step away from work and canceled his appt for today. Attempted to reschedule, however per pt he do know when he will be able to reschedule for. Pt stated that his bp has been averaging 130/90.    03/27/2023:  The patient's elevated blood pressure is likely due to pain from the shoulder injury. He does not have a history of chronic hypertension.     Patient was advised to purchase an Omron blood pressure cuff for home monitoring. He should check his blood pressure at different times of the day and record the readings.     A virtual follow-up appointment will be scheduled in 2 weeks to assess his blood pressure levels.  Orders:  -     amLODIPine (NORVASC) 5 MG tablet; Take 1 tablet by mouth daily, Disp-90 tablet, R-0Normal  -     Stress echo (TTE) exercise (PRN contrast/bubble/strain/3D); Future  -     CT HEAD W WO CONTRAST; Future  2. Acute intractable headache, unspecified headache type  Assessment & Plan:  - The etiology of the headaches could be multifactorial, potentially stemming from uncontrolled hypertension, migraines, or a viral infection.   - A CT head scan will be ordered to rule out any intracranial abnormalities. If CT negative, may refer to neuro  - He is advised to schedule the head scan and echocardiogram concurrently.  - Follow-up in 3 months to monitor symptoms and treatment effectiveness.  3. Fatigue, unspecified type  Assessment & Plan:  - Given his age and symptoms of fatigue, a stress test will be ordered to rule out any cardiac involvement.  - He is advised to schedule the stress test. Central  scheduling number provided   - Follow-up in 3 months to monitor symptoms and treatment effectiveness.  Orders:  -     Stress echo (TTE) exercise (PRN contrast/bubble/strain/3D); Future  -     CT HEAD W WO CONTRAST; Future  4. Pre-syncope  -     CT HEAD W WO CONTRAST; Future  5. Nausea  Assessment & Plan:  Cont zofran prn-prescribed by ED  He develops nausea with severe H/A  6. Hypocalcemia  Assessment & Plan:  Pt has not completed labs ordered in Jan 2025.   Strongly encouraged pt to complete.    03/27/2023:  Calcium level 8.3    Additional labs needed vitamin D, PTH, Vit D, Mag, Phosphorus to rule out parathy dz.    He is advised to get these labs done at [facility] on Saturday morning.      Reviewed medication and completed medication reconciliation with the patient. Reviewed side effects of medications with the patient. Questions were answered and patient verb understanding.    Past Medical History:   Diagnosis Date    Acute intractable headache 06/23/2023    Closed fracture of glenoid cavity and neck of left scapula 03/27/2023    Dyslipidemia 03/27/2023    Elevated blood pressure reading     Environmental and seasonal allergies 03/19/2022    Fatigue 06/23/2023    Hypertension     Hypocalcemia 03/27/2023    Hypokalemia 03/19/2022    Iron deficiency anemia 03/19/2022    Left ureteral stone 09/14/2020    Marijuana smoker 02/24/2020    Multiple lipomas 02/24/2020    Nephrolithiasis     7/12, 4/17, 4/22, 6/22 bilat    Pain in joint of right shoulder     Rotator cuff syndrome of left shoulder 2020    Dr Guido Sander    TB (tuberculosis), treated     Weight loss 02/24/2020     Current Outpatient Medications   Medication Sig    amLODIPine (NORVASC) 5 MG tablet Take 1 tablet by mouth daily    ondansetron (ZOFRAN-ODT) 4 MG disintegrating tablet Take 1 tablet by mouth every 4 hours as needed for Nausea or Vomiting     No current facility-administered medications for this visit.       Past Surgical History:   Procedure Laterality  Date    ORTHOPEDIC SURGERY      left leg pins     Allergies and Intolerances:   No Known Allergies  Family History:   Family History   Problem Relation Age of Onset    Hypertension Mother      Social History:   He  reports that he has never smoked. He has never been exposed to tobacco smoke. He has never used smokeless tobacco.   Social History     Substance and Sexual Activity   Alcohol Use Yes     Immunization History:  Immunization History   Administered Date(s) Administered    COVID-19, MODERNA BLUE border, Primary or Immunocompromised, (age 12y+), IM, 100 mcg/0.24mL 10/21/2019, 11/18/2019         Return in about 3 months (around 09/22/2023) for HTN, No lab.      Dr. Essie Christine, AGNP-C, DNP  [facility]     The patient (or guardian, if applicable) and other individuals in attendance with the patient were advised that Artificial Intelligence will be utilized during this visit to record and process the conversation to generate a clinical note. The patient (or guardian, if applicable) and other individuals in attendance at the appointment consented to the use of AI, including the recording.

## 2023-06-23 NOTE — Assessment & Plan Note (Signed)
 Cont zofran prn-prescribed by ED  He develops nausea with severe H/A

## 2023-06-23 NOTE — Progress Notes (Signed)
 Tim Peterson is a 53 y.o. male (DOB: 1970/06/18) presenting to address:    Chief Complaint   Patient presents with    Migraine     Ongoing for years worsened x 1 month     Dizziness       Vitals:    06/23/23 1029   BP: (!) 138/92   Pulse: 79   Resp: 18   Temp: 98.3 F (36.8 C)   SpO2: 100%       "Have you been to the ER, urgent care clinic since your last visit?  Hospitalized since your last visit?"    NO    "Have you seen or consulted any other health care providers outside of [facility] since your last visit?"    NO

## 2023-06-23 NOTE — Assessment & Plan Note (Signed)
-   Blood pressure readings have consistently been elevated, with diastolic values ranging from 90 to 92.  - Initiate amlodipine 5 mg qd, sent to [facility] pharmacy.  - He is instructed to take this medication in the morning before work and monitor his blood pressure 2 hours post-administration, reporting any readings exceeding 140/90 to this office. If BP levels remain elevated, may increase amlodipine to 10mg  qd.   - An echocardiogram will be ordered to further evaluate cardiac function d/t sx of fatigue and presyncope.    04/10/2023:  Patient was scheduled today for a 2 week follow up for bp check VV. Pt stated that he is unable to step away from work and canceled his appt for today. Attempted to reschedule, however per pt he do know when he will be able to reschedule for. Pt stated that his bp has been averaging 130/90.    03/27/2023:  The patient's elevated blood pressure is likely due to pain from the shoulder injury. He does not have a history of chronic hypertension.     Patient was advised to purchase an Omron blood pressure cuff for home monitoring. He should check his blood pressure at different times of the day and record the readings.     A virtual follow-up appointment will be scheduled in 2 weeks to assess his blood pressure levels.

## 2023-06-23 NOTE — Assessment & Plan Note (Deleted)
 04/10/2023:  Patient was scheduled today for a 2 week follow up for bp check VV. Pt stated that he is unable to step away from work and canceled his appt for today. Attempted to reschedule, however per pt he do know when he will be able to reschedule for. Pt stated that his bp has been averaging 130/90.    03/27/2023:  The patient's elevated blood pressure is likely due to pain from the shoulder injury. He does not have a history of chronic hypertension.     Patient was advised to purchase an Omron blood pressure cuff for home monitoring. He should check his blood pressure at different times of the day and record the readings.     A virtual follow-up appointment will be scheduled in 2 weeks to assess his blood pressure levels.

## 2023-07-22 NOTE — Telephone Encounter (Signed)
 Spoke w/ PCP and informed the radiologist department to verbally change order to CT of the head to w/o contrast per PCP. Order will be placed and PCP is aware of the need to co-sign in epic.

## 2023-07-22 NOTE — Telephone Encounter (Signed)
 Lauren with [facility] radiology called because the patient is scheduled for a CT of the head w w/o contrast. Per the neuroradiologist they do not recommend a CT of the head w w/o contrast unless the patient is being screened for things such as metastatic cancer. Radiology is recommending a MRI of head or a CT of the head w/o contrast.    Pt is scheduled for tomorrow.  Future Appointments   Date Time Provider Department Center   07/23/2023 12:30 PM [facility] STRESS LAB [facility]   07/23/2023  1:30 PM [facility] CT RM 2 [facility]   09/25/2023  1:15 PM Adrienne Horning [facility] [place] ECC DEP       Call back # [phone]    Neuroradiologist direct # if any questions; [phone]

## 2023-07-23 ENCOUNTER — Inpatient Hospital Stay: Payer: BLUE CROSS/BLUE SHIELD | Primary: Nurse Practitioner

## 2023-09-25 ENCOUNTER — Ambulatory Visit
Admit: 2023-09-25 | Discharge: 2023-09-25 | Payer: BLUE CROSS/BLUE SHIELD | Attending: Nurse Practitioner | Primary: Nurse Practitioner

## 2023-09-25 ENCOUNTER — Encounter: Admit: 2023-09-25 | Discharge: 2023-09-25 | Payer: BLUE CROSS/BLUE SHIELD | Primary: Nurse Practitioner

## 2023-09-25 VITALS — BP 128/84 | HR 95 | Temp 99.90000°F | Resp 16 | Ht 73.0 in | Wt 151.0 lb

## 2023-09-25 DIAGNOSIS — I10 Essential (primary) hypertension: Principal | ICD-10-CM

## 2023-09-25 MED ORDER — AMLODIPINE BESYLATE 5 MG PO TABS
5 | ORAL_TABLET | Freq: Every day | ORAL | 1 refills | 90.00000 days | Status: DC
Start: 2023-09-25 — End: 2024-04-01

## 2023-09-25 NOTE — Assessment & Plan Note (Signed)
-   Abnormal lab result indicating low calcium levels (- Potassium: 3.2 (04/2023)  - Labs will be drawn today to recheck calcium levels  - Discussion on the importance of completing lab tests as requested  - Patient will be contacted with the results    06/23/2023:  Pt has not completed labs ordered in Jan 2025.   Strongly encouraged pt to complete.    03/27/2023:  Calcium level 8.3    Additional labs needed vitamin D, PTH, Vit D, Mag, Phosphorus to rule out parathy dz.    He is advised to get these labs done at [facility] on Saturday morning.

## 2023-09-25 NOTE — Assessment & Plan Note (Addendum)
-   Potassium level recorded as 3.2 in 04/2023  - Advised to maintain adequate hydration during outdoor activities but avoid excessive water intake at home  - Consume gatorade to help with electrolytes while outdoors in the heat  - BMP and magnesium level check will be conducted to monitor potassium levels and identify potential contributing factors  - Patient will be contacted with the results

## 2023-09-25 NOTE — Progress Notes (Signed)
 [facility]  [address] Tim [address]  [address]  [place], [place]  [address]  [phone] fax    09/25/2023    Tim Peterson 03/03/1971 is a pleasant Black / African American male.       History of Present Illness  The patient presents for hypocalcemia, hypokalemia, and kidney stones.    HTN  - The patient reports managing his blood pressure by maintaining a high water intake, adhering to his medication regimen, and avoiding stressors.  - Recently, he started taking multivitamins.  - His daily fluid intake includes 8 to 10 bottles of water and 1 to 2 bottles of Gatorade.  - He does not consume soda.    Hypokalemia  - A few months ago, he underwent a DOT physical examination, which included various tests such as potassium level check.  - He reports no cramping in his arms or legs.    Hypocalcemia  Pt forgot to get labs done for f/u of low calcium    Kidney Stones  - He has a history of kidney stones but has learned to manage them without emergency room visits unless the pain is severe.    He has not been experiencing any headaches recently.      Physical Exam  General Appearance: Normal  Vital signs: Within normal limits  HEENT: Within normal limits  Respiratory: Clear to auscultation, no wheezing, rales or rhonchi  Cardiovascular: Regular rate and rhythm, no murmurs, rubs, or gallops  Skin: Warm and dry, no rash  Neurological: Normal  Psychiatric: Normal      Vitals:    09/25/23 1319   BP: 128/84   BP Site: Left Upper Arm   Patient Position: Sitting   BP Cuff Size: Medium Adult   Pulse: 95   Resp: 16   Temp: 99.9 F (37.7 C)   TempSrc: Temporal   SpO2: 100%   Weight: 68.5 kg (151 lb)   Height: 1.854 m (6' 1)       Body mass index is 19.92 kg/m.     Results  - Labs:    - Potassium: 3.2 (04/2023)    Assessment & Plan    1. Uncontrolled hypertension  Assessment & Plan:  BP well-controlled  Continue amlodipine  5 mg daily  Echocardiogram was ordered 06/2023.  Patient did not  complete.  Due to blood pressure being controlled and patient no longer experiencing fatigue and presyncope, will cancel echo.  Follow-up 6 months    06/23/2023:  - Potassium level recorded as 3.2 in 04/2023  - Advised to maintain adequate hydration during outdoor activities but avoid excessive water intake at home  - BMP and magnesium level check will be conducted to monitor potassium levels and identify potential contributing factors  - Patient will be contacted with the results  Orders:  -     amLODIPine  (NORVASC ) 5 MG tablet; Take 1 tablet by mouth daily, Disp-90 tablet, R-1Normal  -     Comprehensive Metabolic Panel; Future  2. Hypokalemia  Assessment & Plan:  - Potassium level recorded as 3.2 in 04/2023  - Advised to maintain adequate hydration during outdoor activities but avoid excessive water intake at home  - Consume gatorade to help with electrolytes while outdoors in the heat  - BMP and magnesium level check will be conducted to monitor potassium levels and identify potential contributing factors  - Patient will be contacted with the results    Orders:  -     Potassium; Future  3. Hypocalcemia  Assessment & Plan:  - Abnormal lab result indicating low calcium levels (- Potassium: 3.2 (04/2023)  - Labs will be drawn today to recheck calcium levels  - Discussion on the importance of completing lab tests as requested  - Patient will be contacted with the results    06/23/2023:  Pt has not completed labs ordered in Jan 2025.   Strongly encouraged pt to complete.    03/27/2023:  Calcium level 8.3    Additional labs needed vitamin D, PTH, Vit D, Mag, Phosphorus to rule out parathy dz.    He is advised to get these labs done at [facility] on Saturday morning.  Orders:  -     Calcium  -     Magnesium  -     PTH, Intact  -     Phosphorus  -     Hemoglobin A1C  -     Vitamin D 25 Hydroxy  4. Dyslipidemia  -     Lipid Panel; Future  5. Special screening for malignant neoplasm of prostate  -     PSA Screening;  Future      Reviewed medication and completed medication reconciliation with the patient. Reviewed side effects of medications with the patient. Questions were answered and patient verb understanding.    Past Medical History:   Diagnosis Date    Acute intractable headache 06/23/2023    Closed fracture of glenoid cavity and neck of left scapula 03/27/2023    Dyslipidemia 03/27/2023    Elevated blood pressure reading     Environmental and seasonal allergies 03/19/2022    Fatigue 06/23/2023    Hypertension     Hypocalcemia 03/27/2023    Hypokalemia 03/19/2022    Iron deficiency anemia 03/19/2022    Left ureteral stone 09/14/2020    Marijuana smoker 02/24/2020    Multiple lipomas 02/24/2020    Nephrolithiasis     7/12, 4/17, 4/22, 6/22 bilat    Pain in joint of right shoulder     Rotator cuff syndrome of left shoulder 2020    Dr Mee    TB (tuberculosis), treated     Weight loss 02/24/2020     Current Outpatient Medications   Medication Sig    amLODIPine  (NORVASC ) 5 MG tablet Take 1 tablet by mouth daily    ondansetron  (ZOFRAN -ODT) 4 MG disintegrating tablet Take 1 tablet by mouth every 4 hours as needed for Nausea or Vomiting     No current facility-administered medications for this visit.       Past Surgical History:   Procedure Laterality Date    ORTHOPEDIC SURGERY      left leg pins     Allergies and Intolerances:   No Known Allergies  Family History:   Family History   Problem Relation Age of Onset    Hypertension Mother      Social History:   He  reports that he has never smoked. He has never been exposed to tobacco smoke. He has never used smokeless tobacco.   Social History     Substance and Sexual Activity   Alcohol Use Yes     Immunization History:  Immunization History   Administered Date(s) Administered    COVID-19, MODERNA BLUE border, Primary or Immunocompromised, (age 12y+), IM, 100 mcg/0.74mL 10/21/2019, 11/18/2019         Return in about 6 months (around 03/27/2024) for Physical, HTN (Lipid, CMP,  PSA).      Dr. Alan Armor, AGNP-C, DNP  [facility]     The patient (or guardian, if applicable) and other individuals in attendance with the patient were advised that Artificial Intelligence will be utilized during this visit to record and process the conversation to generate a clinical note. The patient (or guardian, if applicable) and other individuals in attendance at the appointment consented to the use of AI, including the recording.

## 2023-09-25 NOTE — Assessment & Plan Note (Signed)
 Pt did not complete CT head  No h/a in a long time.     06/23/2023:  - The etiology of the headaches could be multifactorial, potentially stemming from uncontrolled hypertension, migraines, or a viral infection.   - A CT head scan will be ordered to rule out any intracranial abnormalities. If CT negative, may refer to neuro  - He is advised to schedule the head scan and echocardiogram concurrently.  - Follow-up in 3 months to monitor symptoms and treatment effectiveness.

## 2023-09-25 NOTE — Assessment & Plan Note (Addendum)
 BP well-controlled  Continue amlodipine  5 mg daily  Echocardiogram was ordered 06/2023.  Patient did not complete.  Due to blood pressure being controlled and patient no longer experiencing fatigue and presyncope, will cancel echo.  Follow-up 6 months    06/23/2023:  - Potassium level recorded as 3.2 in 04/2023  - Advised to maintain adequate hydration during outdoor activities but avoid excessive water intake at home  - BMP and magnesium level check will be conducted to monitor potassium levels and identify potential contributing factors  - Patient will be contacted with the results

## 2023-09-25 NOTE — Progress Notes (Signed)
 Tim Peterson is a 53 y.o. male (DOB: 03-08-1971) presenting to address:    Chief Complaint   Patient presents with    3 Month Follow-Up       Vitals:    09/25/23 1319   BP: 128/84   Pulse: 95   Resp: 16   Temp: 99.9 F (37.7 C)   SpO2: 100%       Have you been to the ER, urgent care clinic since your last visit?  Hospitalized since your last visit?    NO    "Have you seen or consulted any other health care providers outside of [facility] since your last visit?"    NO

## 2023-09-26 LAB — PTH, INTACT: Pth Intact: 28 pg/mL (ref 15–65)

## 2023-09-26 LAB — HEMOGLOBIN A1C W/O EAG: Hemoglobin A1C: 5.6 % (ref 4.8–5.6)

## 2023-09-26 LAB — VITAMIN D 25 HYDROXY: Vit D, 25-Hydroxy: 32.9 ng/mL (ref 30.0–100.0)

## 2023-09-26 LAB — POTASSIUM: Potassium: 3.8 mmol/L (ref 3.5–5.2)

## 2023-09-26 LAB — PHOSPHORUS: Phosphorus: 2.8 mg/dL (ref 2.8–4.1)

## 2023-09-26 LAB — MAGNESIUM: Magnesium: 2.3 mg/dL (ref 1.6–2.3)

## 2023-09-26 LAB — CALCIUM: Calcium: 9.6 mg/dL (ref 8.7–10.2)

## 2023-10-01 ENCOUNTER — Encounter

## 2023-10-01 MED ORDER — VITAMIN D (ERGOCALCIFEROL) 1.25 MG (50000 UT) PO CAPS
1.25 | ORAL_CAPSULE | ORAL | 1 refills | 84.00000 days | Status: DC
Start: 2023-10-01 — End: 2024-04-01

## 2023-10-01 NOTE — Assessment & Plan Note (Signed)
 10/01/2023:  Potassium 3.8    09/25/2023:  - Potassium level recorded as 3.2 in 04/2023  - Advised to maintain adequate hydration during outdoor activities but avoid excessive water intake at home  - Consume gatorade to help with electrolytes while outdoors in the heat  - BMP and magnesium level check will be conducted to monitor potassium levels and identify potential contributing factors  - Patient will be contacted with the results

## 2023-10-01 NOTE — Assessment & Plan Note (Addendum)
 10/01/2023 lab update:  Vitamin D 32.9  Initiate Vit D2 to be taken WEEKLY x109m  - Recheck level 6 months

## 2023-10-01 NOTE — Telephone Encounter (Addendum)
 Called pt to notify of results. Pt verbalized understanding.

## 2023-10-01 NOTE — Assessment & Plan Note (Signed)
 10/01/2023:  PTH normal  Phosphorus normal  Magnesium normal  Calcium normal     09/25/2023:  - Abnormal lab result indicating low calcium levels (- Potassium: 3.2 (04/2023)  - Labs will be drawn today to recheck calcium levels  - Discussion on the importance of completing lab tests as requested  - Patient will be contacted with the results    06/23/2023:  Pt has not completed labs ordered in Jan 2025.   Strongly encouraged pt to complete.    03/27/2023:  Calcium level 8.3    Additional labs needed vitamin D, PTH, Vit D, Mag, Phosphorus to rule out parathy dz.    He is advised to get these labs done at [facility] on Saturday morning.

## 2023-10-01 NOTE — Other (Signed)
 Tralane, Pls inform pt of the following:  PTH normal  Phosphorus normal  Magnesium normal  Vitamin D 32.9-sent Vit D2 to be taken WEEKLY x22m  Calcium normal   Potassium 3.8  A1c 5.6  Thank you

## 2024-03-25 ENCOUNTER — Other Ambulatory Visit: Admit: 2024-03-25 | Discharge: 2024-03-25 | Payer: BLUE CROSS/BLUE SHIELD | Primary: Nurse Practitioner

## 2024-03-25 DIAGNOSIS — Z125 Encounter for screening for malignant neoplasm of prostate: Principal | ICD-10-CM

## 2024-03-26 LAB — COMPREHENSIVE METABOLIC PANEL
ALT: 34 IU/L (ref 0–44)
AST: 23 IU/L (ref 0–40)
Albumin: 4.5 g/dL (ref 3.8–4.9)
Alkaline Phosphatase: 58 IU/L (ref 47–123)
BUN/Creatinine Ratio: 16 (ref 9–20)
BUN: 14 mg/dL (ref 6–24)
CO2: 25 mmol/L (ref 20–29)
Calcium: 9.5 mg/dL (ref 8.7–10.2)
Chloride: 104 mmol/L (ref 96–106)
Creatinine: 0.9 mg/dL (ref 0.76–1.27)
Est, Glom Filt Rate: 103 mL/min/1.73 (ref >59)
Globulin, Total: 2.2 g/dL (ref 1.5–4.5)
Glucose: 101 mg/dL — ABNORMAL HIGH (ref 70–99)
Potassium: 4.1 mmol/L (ref 3.5–5.2)
Sodium: 142 mmol/L (ref 134–144)
Total Bilirubin: 0.6 mg/dL (ref 0.0–1.2)
Total Protein: 6.7 g/dL (ref 6.0–8.5)

## 2024-03-26 LAB — PSA SCREENING: PSA: 0.6 ng/mL (ref 0.0–4.0)

## 2024-03-26 LAB — LIPID PANEL
Cholesterol, Total: 178 mg/dL (ref 100–199)
HDL: 61 mg/dL (ref >39)
LDL Cholesterol: 106 mg/dL — ABNORMAL HIGH (ref 0–99)
Triglycerides: 54 mg/dL (ref 0–149)
VLDL Cholesterol Calculated: 11 mg/dL (ref 5–40)

## 2024-04-01 ENCOUNTER — Ambulatory Visit
Admit: 2024-04-01 | Discharge: 2024-04-01 | Payer: BLUE CROSS/BLUE SHIELD | Attending: Nurse Practitioner | Primary: Nurse Practitioner

## 2024-04-01 MED ORDER — AMLODIPINE BESYLATE 5 MG PO TABS
5 | ORAL_TABLET | Freq: Every day | ORAL | 1 refills | 90.00000 days | Status: DC
Start: 2024-04-01 — End: 2024-04-01

## 2024-04-01 MED ORDER — VITAMIN D (ERGOCALCIFEROL) 1.25 MG (50000 UT) PO CAPS
1.25 | ORAL_CAPSULE | ORAL | 3 refills | 84.00000 days | Status: AC
Start: 2024-04-01 — End: ?

## 2024-04-01 MED ORDER — AMLODIPINE BESYLATE 5 MG PO TABS
5 | ORAL_TABLET | Freq: Every day | ORAL | 3 refills | 90.00000 days | Status: AC
Start: 2024-04-01 — End: ?

## 2024-04-01 NOTE — Progress Notes (Signed)
 Have you been to the ER, urgent care clinic since your last visit?  Hospitalized since your last visit?    NO    ?Have you seen or consulted any other health care providers outside our system since your last visit??    NO
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
## 2024-04-07 NOTE — Assessment & Plan Note (Signed)
 04/02/2023:  - TG 54; LDL 104-106  - Advised to reduce intake of fried and fatty foods to manage cholesterol levels    03/27/2023:  TG 53; HDL 63; LDL 104  He is advised to continue eating a healthy diet, avoiding fried and fatty foods, and to monitor his cholesterol levels regularly.  Recheck level 12 months
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
## 2024-04-07 NOTE — Assessment & Plan Note (Signed)
 04/01/2024:  Health Maintenance:  - Tetanus vaccine to be administered today  - Fasting labs, including CMP, lipid panel, and PSA, to be ordered one week prior to next visit    Labs:    - Prostate number: 0.6    - Triglycerides: 54    - LDL cholesterol: 106    - Vitamin D  level: 32.9    - Imaging:    - CT of the spine (02/17/2023): Mild disk space narrowing and spurring at C5-C6    - CT of the lung apices: Emphysematous changes    Specialists:  N/a

## 2024-04-07 NOTE — Assessment & Plan Note (Signed)
 04/01/2024:  - Last recorded vitamin D  level: 32.9  - Prescription for vitamin D  50,000 IU once weekly for one year  - Recheck vitamin D  levels in one year    10/01/2023:  10/01/2023 lab update:  Vitamin D  32.9  Initiate Vit D2 to be taken WEEKLY x84m  - Recheck level 6 months

## 2024-04-07 NOTE — Assessment & Plan Note (Signed)
 04/01/2024:  - Mild emphysematous changes noted in lung apices  - Advised to cease smoking marijuana and consume in edible form if necessary to prevent further development of emphysema

## 2024-04-07 NOTE — Assessment & Plan Note (Signed)
 04/01/2024:  - Blood pressure well-controlled on current regimen of amlodipine  5 mg qd  - Advised to monitor blood pressure regularly and seek medical attention if it increases  - Prescription for amlodipine  5 mg for one year    09/25/2023:  BP well-controlled  Continue amlodipine  5 mg daily  Echocardiogram was ordered 06/2023.  Patient did not complete.  Due to blood pressure being controlled and patient no longer experiencing fatigue and presyncope, will cancel echo.  Follow-up 6 months    06/23/2023:  - Potassium level recorded as 3.2 in 04/2023  - Advised to maintain adequate hydration during outdoor activities but avoid excessive water intake at home  - BMP and magnesium level check will be conducted to monitor potassium levels and identify potential contributing factors  - Patient will be contacted with the results

## 2024-04-13 ENCOUNTER — Encounter: Payer: BLUE CROSS/BLUE SHIELD | Primary: Nurse Practitioner

## 2024-04-26 ENCOUNTER — Encounter: Payer: BLUE CROSS/BLUE SHIELD | Primary: Nurse Practitioner
